# Patient Record
Sex: Female | Born: 1980 | Race: Black or African American | Hispanic: No | Marital: Single | Smoking: Former smoker
Health system: Southern US, Community
[De-identification: ages and names within clinical notes are randomized; demographics above are authoritative.]

## PROBLEM LIST (undated history)

## (undated) DIAGNOSIS — Z9889 Other specified postprocedural states: Secondary | ICD-10-CM

## (undated) DIAGNOSIS — J40 Bronchitis, not specified as acute or chronic: Secondary | ICD-10-CM

## (undated) DIAGNOSIS — R112 Nausea with vomiting, unspecified: Secondary | ICD-10-CM

## (undated) DIAGNOSIS — J45909 Unspecified asthma, uncomplicated: Secondary | ICD-10-CM

## (undated) HISTORY — DX: Unspecified asthma, uncomplicated: J45.909

## (undated) SURGERY — BREAST REDUCTION WITH LIPOSUCTION
Anesthesia: General | Laterality: Bilateral

---

## 1999-10-30 ENCOUNTER — Emergency Department (HOSPITAL_COMMUNITY): Admission: EM | Admit: 1999-10-30 | Discharge: 1999-10-30 | Payer: Self-pay | Admitting: Emergency Medicine

## 1999-10-30 ENCOUNTER — Encounter: Payer: Self-pay | Admitting: Emergency Medicine

## 2001-10-13 ENCOUNTER — Emergency Department (HOSPITAL_COMMUNITY): Admission: EM | Admit: 2001-10-13 | Discharge: 2001-10-14 | Payer: Self-pay | Admitting: Internal Medicine

## 2001-10-14 ENCOUNTER — Encounter: Payer: Self-pay | Admitting: *Deleted

## 2002-05-23 ENCOUNTER — Emergency Department (HOSPITAL_COMMUNITY): Admission: EM | Admit: 2002-05-23 | Discharge: 2002-05-24 | Payer: Self-pay | Admitting: Emergency Medicine

## 2002-05-24 ENCOUNTER — Encounter: Payer: Self-pay | Admitting: Emergency Medicine

## 2004-12-10 ENCOUNTER — Emergency Department (HOSPITAL_COMMUNITY): Admission: EM | Admit: 2004-12-10 | Discharge: 2004-12-10 | Payer: Self-pay | Admitting: Emergency Medicine

## 2005-06-06 ENCOUNTER — Ambulatory Visit (HOSPITAL_COMMUNITY): Admission: RE | Admit: 2005-06-06 | Discharge: 2005-06-06 | Payer: Self-pay | Admitting: Obstetrics & Gynecology

## 2005-10-11 ENCOUNTER — Inpatient Hospital Stay (HOSPITAL_COMMUNITY): Admission: AD | Admit: 2005-10-11 | Discharge: 2005-10-15 | Payer: Self-pay | Admitting: Obstetrics & Gynecology

## 2006-10-05 ENCOUNTER — Emergency Department (HOSPITAL_COMMUNITY): Admission: EM | Admit: 2006-10-05 | Discharge: 2006-10-06 | Payer: Self-pay | Admitting: Emergency Medicine

## 2007-03-25 ENCOUNTER — Emergency Department (HOSPITAL_COMMUNITY): Admission: EM | Admit: 2007-03-25 | Discharge: 2007-03-25 | Payer: Self-pay | Admitting: Emergency Medicine

## 2008-12-21 ENCOUNTER — Emergency Department (HOSPITAL_COMMUNITY): Admission: EM | Admit: 2008-12-21 | Discharge: 2008-12-21 | Payer: Self-pay | Admitting: Emergency Medicine

## 2009-07-05 ENCOUNTER — Ambulatory Visit: Payer: Self-pay | Admitting: Internal Medicine

## 2009-07-05 DIAGNOSIS — R05 Cough: Secondary | ICD-10-CM

## 2009-07-05 DIAGNOSIS — J45909 Unspecified asthma, uncomplicated: Secondary | ICD-10-CM | POA: Insufficient documentation

## 2009-07-05 DIAGNOSIS — R059 Cough, unspecified: Secondary | ICD-10-CM | POA: Insufficient documentation

## 2009-07-20 ENCOUNTER — Ambulatory Visit: Payer: Self-pay | Admitting: Internal Medicine

## 2009-07-20 DIAGNOSIS — J310 Chronic rhinitis: Secondary | ICD-10-CM | POA: Insufficient documentation

## 2009-09-17 ENCOUNTER — Emergency Department (HOSPITAL_COMMUNITY): Admission: EM | Admit: 2009-09-17 | Discharge: 2009-09-17 | Payer: Self-pay | Admitting: Emergency Medicine

## 2009-09-21 ENCOUNTER — Ambulatory Visit: Payer: Self-pay | Admitting: Internal Medicine

## 2009-10-09 HISTORY — PX: SHOULDER SURGERY: SHX246

## 2010-02-10 ENCOUNTER — Ambulatory Visit: Payer: Self-pay | Admitting: Internal Medicine

## 2010-04-15 ENCOUNTER — Telehealth (INDEPENDENT_AMBULATORY_CARE_PROVIDER_SITE_OTHER): Payer: Self-pay | Admitting: *Deleted

## 2010-05-02 ENCOUNTER — Telehealth: Payer: Self-pay | Admitting: Internal Medicine

## 2010-05-04 ENCOUNTER — Ambulatory Visit: Payer: Self-pay | Admitting: Internal Medicine

## 2010-06-16 ENCOUNTER — Telehealth: Payer: Self-pay | Admitting: Internal Medicine

## 2010-07-13 ENCOUNTER — Emergency Department (HOSPITAL_BASED_OUTPATIENT_CLINIC_OR_DEPARTMENT_OTHER)
Admission: EM | Admit: 2010-07-13 | Discharge: 2010-07-13 | Payer: Self-pay | Source: Home / Self Care | Admitting: Emergency Medicine

## 2010-07-14 ENCOUNTER — Telehealth: Payer: Self-pay | Admitting: Internal Medicine

## 2010-11-08 NOTE — Progress Notes (Signed)
Summary: nos appt  Phone Note Call from Patient   Caller: [email or web address]  Call For: [name] Summary of Call: Rsc nos from 9/8 to 10/5 @ 3:50p. Initial call taken by: Darletta Moll,  June 16, 2010 2:33 PM

## 2010-11-08 NOTE — Progress Notes (Signed)
Summary: nos appt  Phone Note Call from Patient   Caller: [email or web address]  Call For: Heather Wade Summary of Call: LMTCB x2 to rsc nos from 10/5. Initial call taken by: Darletta Moll,  July 14, 2010 3:21 PM

## 2010-11-08 NOTE — Assessment & Plan Note (Signed)
Summary: Pulmonary/ acute ext ov hfa 75% > change back to symbicort   Copy to:  Dr. Zonia Kief  Primary Provider/Referring Provider:  none  CC:  Acute visit.  Pt c/o increased SOB since started advair in May.  She states she has to use ventoilin approx twice a day and then wakes up in the night with SOB and has to use.  Marland Kitchen  History of Present Illness: 30 yo female quit smoking 04/2009  with h/o bronchitis in 3rd grade  then hosp 2006 [facility] with asthma exac with no chronic resp concerns or need for maint medication at that time  July 05, 2009 ov with new abrupt onset 03/2009  persistent cough assoc with sob, cough productive of green mucus rx with abx now white and scant but still coughing esp in middle of night with cough and sob. cough so hard she gags but does not vomit,  nasonex helped sinuses but not cough, ventolin helps some for a few hours. Singulair just started 2 weeks before ov ? helping at all ?   Prednisone made the most difference.   rec Pepcid 20mg  ac at bedtime and continue singulair start symbicort 160 2 puffs first thing  in am and 2 puffs again in pm about 12 hours later  ventolin hfa 2 puffs every 4 hours if needed  July 20, 2009  2 wk followup.  Pt states that her breathing has improved.  She has only needed rescue inhaler on one occasion.   September 21, 2009 Followup with PFT's wnl, only using symbiocort now  Feb 10, 2010 cc  ran out of symbicort about 2-3 wks ago due to insurance not covering med.  She c/o prod cough x 1 wk with green to clear sputum.  States that her breathing is the same.while on symbicort no need for ventolin at all now back to using it like she was before symbicort including waking up at night and needing it most nights.   rec advair 100/50 twice daily but worse and restarted albuterol twice daily to control cough and wheeze  May 04, 2010 Acute visit.  Pt c/o increased SOB since started advair in May.  She states she has to use ventoilin approx  twice a day and then wakes up in the night with SOB and has to use.  no purulent sputum or overt HB.  Pt denies any significant sore throat, dysphagia, itching, sneezing,  nasal congestion or excess secretions,  fever, chills, sweats, unintended wt loss, pleuritic or exertional cp, hempoptysis,  or leg swelling   Current Medications (verified): 1)  Ventolin Hfa 108 (90 Base) Mcg/act Aers (Albuterol Sulfate) .... 2 Puffs Every 4 Hours As Needed 2)  Ibuprofen 200 Mg Tabs (Ibuprofen) .... As Needed 3)  Albuterol Sulfate (2.5 Mg/8ml) 0.083% Nebu (Albuterol Sulfate) .Marland Kitchen.. 1 Vial in Nebulizer Two Times A Day As Needed  Allergies (verified): No Known Drug Allergies  Past History:  Past Medical History: Asthmatic Bronchitis    - HFA  90% July 05, 2009 > 100% July 20, 2009 >  75% September 21, 2009 > 90% May 04, 2010     - PFT's WNL  September 21, 2009     - Change to Advair 100 due to insurance Feb 10, 2010 > much worse May 04, 2010 > change back to symb  Vital Signs:  Patient profile:   30 year old female Weight:      154.50 pounds O2 Sat:  96 % on Room air Temp:     98.7 degrees F oral Pulse rate:   58 / minute BP sitting:   134 / 88  (left arm)  Vitals Entered By: Vernie Murders (May 04, 2010 11:10 AM)  O2 Flow:  Room air  Physical Exam  Additional Exam:  wt 159 July 05, 2009 > 159 July 20, 2009 > 158 September 21, 2009 > 156 Feb 10, 2010 > 154 May 04, 2010  HEENT: nl dentition, turbinates, and orophanx. Nl external ear canals without cough reflex NECK :  without JVD/Nodes/TM/ nl carotid upstrokes bilaterally LUNGS:mid exp wheeze bilaterally with mod increase t exp but no increase wob CV:  RRR  no s3 or murmur or increase in P2, no edema   ABD:  soft and nontender with nl excursion in the supine position. No bruits or organomegaly, bowel sounds nl MS:  warm without deformities, calf tenderness, cyanosis or clubbing      Impression &  Recommendations:  Problem # 1:  ASTHMA, UNSPECIFIED (ICD-493.90)   DDX of  difficult airways managment all start with A and  include Adherence, Ace Inhibitors, Acid Reflux, Active Sinus Disease, Alpha 1 Antitripsin deficiency, Anxiety masquerading as Airways dz,  ABPA,  allergy(esp in young), Aspiration (esp in elderly), Adverse effects of DPI,  Active smokers, plus one B  = Beta blocker use..   Adverse effect of dpi apparent here, much worse control with advair  Samples given for symbicort 160 2 puffs first thing  in am and 2 puffs again in pm about 12 hours later and I spent extra time with the patient today explaining optimal mdi  technique.  This improved from  50 -75% with coaching  Medications Added to Medication List This Visit: 1)  Albuterol Sulfate (2.5 Mg/66ml) 0.083% Nebu (Albuterol sulfate) .Marland Kitchen.. 1 vial in nebulizer two times a day as needed 2)  Symbicort 160-4.5 Mcg/act Aero (Budesonide-formoterol fumarate) .... 2 puffs first thing  in am and 2 puffs again in pm about 12 hours later  Other Orders: Est. Patient Level IV (16109)  Patient Instructions: 1)  Call [employer] and ask about mail order your for your symbicort  2)  Please schedule a follow-up appointment in 6 weeks, sooner if needed

## 2010-11-08 NOTE — Progress Notes (Signed)
Summary: nos appt  Phone Note Call from Patient   Caller: [email or web address]  Call For: wert Summary of Call: LMTCB to rsc nos from 7/7. Initial call taken by: Darletta Moll,  April 15, 2010 3:45 PM

## 2010-11-08 NOTE — Assessment & Plan Note (Signed)
Summary: Pulmonary/ ext f/u ov, try advair due to insurance issues   Copy to:  Dr. Zonia Kief  Primary Provider/Referring Provider:  none  CC:  Followup.  Pt states that she ran out of symbicort about 2-3 wks ago due to insurance not covering med.  She c/o prod cough x 1 wk with green to clear sputum.  States that her breathing is the same.Heather Wade  History of Present Illness: 30 yo female quit smoking 04/2009  with h/o bronchitis in 3rd grade  then hosp 2006 [facility] with asthma exac with no chronic resp concerns or need for maint medication at that time  July 05, 2009 ov with new abrupt onset 03/2009  persistent cough assoc with sob, cough productive of green mucus rx with abx now white and scant but still coughing esp in middle of night with cough and sob. cough so hard she gags but does not vomit,  nasonex helped sinuses but not cough, ventolin helps some for a few hours. Singulair just started 2 weeks before ov ? helping at all ?   Prednisone made the most difference.   rec Pepcid 20mg  ac at bedtime and continue singulair start symbicort 160 2 puffs first thing  in am and 2 puffs again in pm about 12 hours later  ventolin hfa 2 puffs every 4 hours if needed  July 20, 2009  2 wk followup.  Pt states that her breathing has improved.  She has only needed rescue inhaler on one occasion.   September 21, 2009 Followup with PFT's wnl, only using symbiocort now  Feb 10, 2010 cc  ran out of symbicort about 2-3 wks ago due to insurance not covering med.  She c/o prod cough x 1 wk with green to clear sputum.  States that her breathing is the same.while on symbicort no need for ventolin at all now back to using it like she was before symbicort including waking up at night and needing it most nights.  Pt denies any significant sore throat, nasal congestion or excess secretions, fever, chills, sweats, unintended wt loss, pleuritic or exertional cp, orthopnea pnd or leg swelling.  Pt also denies any obvious  fluctuation in symptoms with weather or environmental change or other alleviating or aggravating factors.       Current Medications (verified): 1)  Ventolin Hfa 108 (90 Base) Mcg/act Aers (Albuterol Sulfate) .... 2 Puffs Every 4 Hours As Needed 2)  Ibuprofen 200 Mg Tabs (Ibuprofen) .... As Needed  Allergies (verified): No Known Drug Allergies  Past History:  Past Medical History: Asthmatic Bronchitis    - HFA  90% July 05, 2009 > 100% July 20, 2009 >  75% September 21, 2009     - PFT's WNL  September 21, 2009     - Change to Advair 100 due to insurance Feb 10, 2010   Vital Signs:  Patient profile:   30 year old female Weight:      156 pounds O2 Sat:      97 % on Room air Temp:     98.3 degrees F oral Pulse rate:   77 / minute BP sitting:   124 / 84  (left arm)  Vitals Entered By: Vernie Murders (Feb 10, 2010 10:49 AM)  O2 Flow:  Room air  Physical Exam  Additional Exam:  wt 159 July 05, 2009 > 159 July 20, 2009 > 158 September 21, 2009 > 156 Feb 10, 2010  HEENT: nl dentition, turbinates, and  orophanx. Nl external ear canals without cough reflex NECK :  without JVD/Nodes/TM/ nl carotid upstrokes bilaterally LUNGS: no acc muscle use,clear bilaterally after using albuterol already this am CV:  RRR  no s3 or murmur or increase in P2, no edema   ABD:  soft and nontender with nl excursion in the supine position. No bruits or organomegaly, bowel sounds nl MS:  warm without deformities, calf tenderness, cyanosis or clubbing      Impression & Recommendations:  Problem # 1:  ASTHMA, UNSPECIFIED (ICD-493.90)   DDX of  difficult airways managment all start with A and  include Adherence, Ace Inhibitors, Acid Reflux, Active Sinus Disease, Alpha 1 Antitripsin deficiency, Anxiety masquerading as Airways dz,  ABPA,  allergy(esp in young), Aspiration (esp in elderly), Adverse effects of DPI,  Active smokers, plus one B  = Beta blocker use..   In this case Adherence is the  biggest issue and starts with  inability to use HFA effectively and also  understand that SABA treats the symptoms but doesn't get to the underlying problem (inflammation).  I used  the ananology of putting steroid cream on a rash to help explain the meaning of topical therapy and the need to get the drug to the target tissue.  Try advair  I discussed the importance of understanding the goals of asthma therapy including the rule of 2's as it applies to the use of a rescue inhaler.   Medications Added to Medication List This Visit: 1)  Advair Diskus 100-50 Mcg/dose Misc (Fluticasone-salmeterol) .... One twice daily 2)  Ventolin Hfa 108 (90 Base) Mcg/act Aers (Albuterol sulfate) .... 2 puffs every 4 hours as needed  Other Orders: Est. Patient Level IV (63875)  Patient Instructions: 1)  Start Advair 100/50 one twice daily and if breathing better and less need for rescue by the first of the week May 9th then see if your insurance  2)  If your breathing worsens or you need to use your rescue inhaler more than twice weekly or wake up more than twice a month with any respiratory symptoms or require more than two rescue inhalers per year, we need to see you right away. 3)    Prescriptions: VENTOLIN HFA 108 (90 BASE) MCG/ACT AERS (ALBUTEROL SULFATE) 2 puffs every 4 hours as needed  #1 x 2   Entered and Authorized by:   Nyoka Cowden MD   Signed by:   Nyoka Cowden MD on 02/10/2010   Method used:   Print then Give to Patient   RxID:   [id number] ADVAIR DISKUS 100-50 MCG/DOSE  MISC (FLUTICASONE-SALMETEROL) one twice daily  #1 x 11   Entered and Authorized by:   Nyoka Cowden MD   Signed by:   Nyoka Cowden MD on 02/10/2010   Method used:   Print then Give to Patient   RxID:   [id number]

## 2010-11-08 NOTE — Progress Notes (Signed)
Summary: nos appt  Phone Note Call from Patient   Caller: [email or web address]  Call For: Aerie Donica Summary of Call: Rsc nos from 7/22 to 7/27 @ 10:45a. Initial call taken by: Darletta Moll,  May 02, 2010 2:48 PM

## 2011-02-24 NOTE — H&P (Signed)
NAMELANAH, Heather Wade        ACCOUNT NO.:  [id number]   MEDICAL RECORD NO.:  [id number]          PATIENT TYPE:  INP   LOCATION:  9166                          FACILITY:  [facility]   PHYSICIAN:  Roseanna Rainbow, M.D.DATE OF BIRTH:  09/19/1981   DATE OF ADMISSION:  10/11/2005  DATE OF DISCHARGE:                                HISTORY & PHYSICAL   CHIEF COMPLAINT:  The patient is a 30 year old para 0 with an estimated date  of confinement of October 12, 2005, who presented to the office for routine  prenatal visit and was found to have elevated blood pressures.   HISTORY OF PRESENT ILLNESS:  Please see the above.  Blood pressures in the  office today were elevated as per Dr. Clearance Coots.  The patient denied any  symptoms consistent with severe pregnancy induced hypertension.   ALLERGIES:  No known drug allergies.   MEDICATIONS:  Prenatal vitamins.   LABORATORY DATA:  Blood type O positive, antibody screen negative.  One hour  GCT 115, GBS positive on December 14.  Hepatitis B surface antigen negative.  Hemoglobin 11.1, hematocrit 33.5, platelets 279,000.  Rubella immune.   SOCIAL HISTORY:  No tobacco, ethanol, or drug use.   PAST MEDICAL HISTORY:  She denies.   PAST SURGICAL HISTORY:  Wrist surgery.   PAST OB-GYN HISTORY:  She denies.   PHYSICAL EXAMINATION:  VITAL SIGNS:  Stable.  Blood pressures 150s over 90s.  Fetal heart tracing reassuring.  Tocodynamometer regular uterine  contractions.  GENERAL:  No apparent distress.  ABDOMEN:  Gravid.  VAGINAL:  As per the RN cervix is 1 cm dilated, 60 % effaced.  PIH lab panel normal.   ASSESSMENT:  1.  Primigravida with an intrauterine pregnancy at term.  2.  Mild pregnancy induced hypertension.  Fetal heart tracing consistent      with fetal well-being.  Borderline Bishop's score.  GBS positive.   PLAN:  1.  Admission.  2.  Two stage induction.  Will begin with cervical ripening to be followed      by Pitocin and AROM,  penicillin, GBS prophylaxis in early labor.      Roseanna Rainbow, M.D.  Electronically Signed     LAJ/MEDQ  D:  10/11/2005  T:  10/11/2005  Job:  [id number]

## 2011-02-24 NOTE — Consult Note (Signed)
NAME:  Heather Wade, Heather Wade                  ACCOUNT NO.:  [id number]   MEDICAL RECORD NO.:  [id number]                   PATIENT TYPE:  EMS   LOCATION:  ED                                   FACILITY:  [facility]   PHYSICIAN:  J. Darreld Mclean, M.D.              DATE OF BIRTH:  [birth year]   DATE OF CONSULTATION:  DATE OF DISCHARGE:  05/24/2002                          ORTHOPEDIC SURGERY CONSULTATION   HISTORY OF PRESENT ILLNESS:  The patient is a 30 year old female who fell  down some steps tonight and dislocated her right shoulder.  Dislocated it  once previously approximately two or three years ago.  No other injuries.   PAST SURGICAL HISTORY:  None.   ALLERGIES:  None.   MEDICATIONS:  None.   She last ate supper or some food around 7:00.  She had something to drink  around 9:00.  She was brought promptly to the hospital.  X-rays showed  dislocation of the right shoulder, no other injuries.  The patient is not  married.  Her sister brought her.   PHYSICAL EXAMINATION:  VITAL SIGNS:  Normal.   HEENT:  Negative.   NECK:  Supple.   LUNGS:  Clear to P&A.   HEART:  Regular without murmur, rub, or gallop.   ABDOMEN:  Soft, nontender without masses.   EXTREMITIES:  Pain and tenderness with any motion of the right shoulder,  obvious deformity.  Lower extremities within normal limits.  _________.  SKIN:  Intact.   IMPRESSION:  Dislocation anteriorly right shoulder.   Conscious sedation as explained to the patient.  The patient signed an  informed consent.  I explained what I would do.  Told her she may have some  temporary amnesia secondary to the conscious sedation.   Subconscious sedation protocol was begun.  IV had already been previously  inserted.  Versed was given by me slowly IV.  Used a small test dose,  monitored vital signs, and then she received a total of 6 mg.  When she was  sedated closed reduction was carried out through a modified Hypocratic  maneuver.   There was a audible clunk or pop and the shoulder was relocated.  The patient was placed in shoulder mobilizer, sat up.  Vital signs remained  stable.  X-rays were ordered and portable x-ray was done.  The patient  underwent procedure well.  Prescription given for Vicodin 5/500 for pain.  Patient information booklet given.  I will see in the office Monday morning.  If any difficulty or problems just come back to the emergency room.  Numbers  to contact me through the office and the hospital beeper system over the  weekend have been provided.  Teola Bradley, M.D.    JWK/MEDQ  D:  05/24/2002  T:  05/27/2002  Job:  [id number]

## 2011-08-13 ENCOUNTER — Emergency Department (HOSPITAL_COMMUNITY)
Admission: EM | Admit: 2011-08-13 | Discharge: 2011-08-13 | Disposition: A | Discharge status: Home / Self Care | Payer: Self-pay | Attending: Emergency Medicine | Admitting: Emergency Medicine

## 2011-08-13 DIAGNOSIS — R05 Cough: Secondary | ICD-10-CM | POA: Insufficient documentation

## 2011-08-13 DIAGNOSIS — R0789 Other chest pain: Secondary | ICD-10-CM | POA: Insufficient documentation

## 2011-08-13 DIAGNOSIS — R0602 Shortness of breath: Secondary | ICD-10-CM | POA: Insufficient documentation

## 2011-08-13 DIAGNOSIS — R059 Cough, unspecified: Secondary | ICD-10-CM | POA: Insufficient documentation

## 2011-08-13 DIAGNOSIS — J45909 Unspecified asthma, uncomplicated: Secondary | ICD-10-CM

## 2011-08-13 DIAGNOSIS — J45901 Unspecified asthma with (acute) exacerbation: Secondary | ICD-10-CM | POA: Insufficient documentation

## 2011-08-13 HISTORY — DX: Bronchitis, not specified as acute or chronic: J40

## 2011-08-13 MED ORDER — ALBUTEROL SULFATE HFA 108 (90 BASE) MCG/ACT IN AERS
2.0000 | INHALATION_SPRAY | RESPIRATORY_TRACT | Status: DC
  Administered 2011-08-13: 15:00:00 via RESPIRATORY_TRACT

## 2011-08-13 MED ORDER — ALBUTEROL SULFATE (5 MG/ML) 0.5% IN NEBU
INHALATION_SOLUTION | RESPIRATORY_TRACT | Status: AC
  Administered 2011-08-13: 5 mg via RESPIRATORY_TRACT
  Filled 2011-08-13: qty 1

## 2011-08-13 MED ORDER — ALBUTEROL SULFATE HFA 108 (90 BASE) MCG/ACT IN AERS
INHALATION_SPRAY | RESPIRATORY_TRACT | Status: AC
  Filled 2011-08-13: qty 6.7

## 2011-08-13 MED ORDER — PREDNISONE 10 MG PO TABS: 20.0000 mg | ORAL_TABLET | Freq: Every day | ORAL | Status: AC

## 2011-08-13 MED ORDER — PREDNISONE 20 MG PO TABS
ORAL_TABLET | ORAL | Status: AC
  Filled 2011-08-13: qty 3

## 2011-08-13 MED ORDER — ALBUTEROL SULFATE (5 MG/ML) 0.5% IN NEBU
5.0000 mg | INHALATION_SOLUTION | Freq: Once | RESPIRATORY_TRACT | Status: AC
  Administered 2011-08-13: 5 mg via RESPIRATORY_TRACT

## 2011-08-13 MED ORDER — PREDNISONE 20 MG PO TABS
60.0000 mg | ORAL_TABLET | Freq: Once | ORAL | Status: AC
  Administered 2011-08-13: 60 mg via ORAL

## 2011-08-13 MED ORDER — BUDESONIDE-FORMOTEROL FUMARATE 160-4.5 MCG/ACT IN AERO: 2.0000 | INHALATION_SPRAY | Freq: Two times a day (BID) | RESPIRATORY_TRACT | Status: DC

## 2011-08-13 NOTE — ED Provider Notes (Signed)
History     CSN: [id number] Arrival date & time: 08/13/2011 10:50 AM   First MD Initiated Contact with Patient 08/13/11 1118      Chief Complaint  Patient presents with  . Shortness of Breath    pt in with stated sob states was dx with acute bronchitis last year states no medication taken onset 2 weeks ago with no relief    (Consider location/radiation/quality/duration/timing/severity/associated sxs/prior treatment) HPI Complains of nonproductive cough shortness of breath and wheeze onset one month ago, worse over the past few days no fever symptoms typical of asthmatic bronchitis she's had in the past. Patient states she ran out of her albuterol and Symbicort inhalers. No treatment prior to coming here. Complains of chest tightness with cough, symptoms moderate nonradiating. No other associated symptoms Past Medical History  Diagnosis Date  . Bronchitis     History reviewed. No pertinent past surgical history.  No family history on file.  History  Substance Use Topics  . Smoking status: Not on file  . Smokeless tobacco: Not on file  . Alcohol Use: Yes     occasional drinker   Smokes marijuana OB History    Grav Para Term Preterm Abortions TAB SAB Ect Mult Living                  Review of Systems  Constitutional: Negative.   HENT: Negative.   Respiratory: Positive for chest tightness, shortness of breath and wheezing.   Cardiovascular: Negative.   Gastrointestinal: Negative.   Musculoskeletal: Negative.   Skin: Negative.   Neurological: Negative.   Hematological: Negative.   Psychiatric/Behavioral: Negative.   All other systems reviewed and are negative.    Allergies  Review of patient's allergies indicates no known allergies.  Home Medications   Current Outpatient Rx  Name Route Sig Dispense Refill  . ALBUTEROL SULFATE HFA 108 (90 BASE) MCG/ACT IN AERS Inhalation Inhale 2 puffs into the lungs every 4 (four) hours as needed. For shortness of breath.       . BUDESONIDE-FORMOTEROL FUMARATE 160-4.5 MCG/ACT IN AERO Inhalation Inhale 2 puffs into the lungs 2 (two) times daily.        BP 121/89  Pulse 95  Temp(Src) 98.8 F (37.1 C) (Oral)  Resp 16  SpO2 100%  LMP 07/17/2011  Physical Exam  Nursing note and vitals reviewed. Constitutional: She appears well-developed and well-nourished.  HENT:  Head: Normocephalic and atraumatic.  Eyes: Conjunctivae are normal. Pupils are equal, round, and reactive to light.  Neck: Neck supple. No tracheal deviation present. No thyromegaly present.  Cardiovascular: Normal rate and regular rhythm.   No murmur heard. Pulmonary/Chest: She has wheezes.       Prolonged expiratory phase  Abdominal: Soft. Bowel sounds are normal. She exhibits no distension. There is no tenderness.  Musculoskeletal: Normal range of motion. She exhibits no edema and no tenderness.  Neurological: She is alert. Coordination normal.  Skin: Skin is warm and dry. No rash noted.  Psychiatric: She has a normal mood and affect.    ED Course  Procedures (including critical care time)  Labs Reviewed - No data to display No results found.   No diagnosis found.  2:45 PM patient is breathing at baseline speaks in paragraphs no distress MDM  Plan prescription for Symbicort inhaler albuterol inhaler to go to use 2 puffs every 4 hours when necessary cough or shortness of breath prescription prednisone Referral [facility] Diagnoses asthamatic bronchitis  Doug Sou, MD 08/13/11 1455

## 2011-08-13 NOTE — ED Notes (Signed)
Respiratory contacted to administer breathing treatment. Pt in No distress.

## 2011-11-18 ENCOUNTER — Encounter (HOSPITAL_COMMUNITY): Payer: Self-pay | Admitting: Emergency Medicine

## 2011-11-18 ENCOUNTER — Emergency Department (HOSPITAL_COMMUNITY): Payer: Self-pay

## 2011-11-18 ENCOUNTER — Emergency Department (HOSPITAL_COMMUNITY)
Admission: EM | Admit: 2011-11-18 | Discharge: 2011-11-18 | Disposition: A | Discharge status: Home / Self Care | Payer: Self-pay | Attending: Emergency Medicine | Admitting: Emergency Medicine

## 2011-11-18 DIAGNOSIS — R062 Wheezing: Secondary | ICD-10-CM | POA: Insufficient documentation

## 2011-11-18 DIAGNOSIS — R059 Cough, unspecified: Secondary | ICD-10-CM | POA: Insufficient documentation

## 2011-11-18 DIAGNOSIS — R05 Cough: Secondary | ICD-10-CM | POA: Insufficient documentation

## 2011-11-18 DIAGNOSIS — R0602 Shortness of breath: Secondary | ICD-10-CM | POA: Insufficient documentation

## 2011-11-18 DIAGNOSIS — J40 Bronchitis, not specified as acute or chronic: Secondary | ICD-10-CM | POA: Insufficient documentation

## 2011-11-18 MED ORDER — IPRATROPIUM BROMIDE 0.02 % IN SOLN
0.5000 mg | Freq: Once | RESPIRATORY_TRACT | Status: AC
  Administered 2011-11-18: 0.5 mg via RESPIRATORY_TRACT
  Filled 2011-11-18: qty 2.5

## 2011-11-18 MED ORDER — ALBUTEROL SULFATE (5 MG/ML) 0.5% IN NEBU
5.0000 mg | INHALATION_SOLUTION | Freq: Once | RESPIRATORY_TRACT | Status: AC
  Administered 2011-11-18: 5 mg via RESPIRATORY_TRACT
  Filled 2011-11-18: qty 1

## 2011-11-18 MED ORDER — ALBUTEROL SULFATE HFA 108 (90 BASE) MCG/ACT IN AERS
2.0000 | INHALATION_SPRAY | RESPIRATORY_TRACT | Status: AC
  Administered 2011-11-18: 2 via RESPIRATORY_TRACT
  Filled 2011-11-18: qty 6.7

## 2011-11-18 MED ORDER — IPRATROPIUM BROMIDE 0.02 % IN SOLN
RESPIRATORY_TRACT | Status: AC
  Administered 2011-11-18: 0.5 mg
  Filled 2011-11-18: qty 2.5

## 2011-11-18 MED ORDER — PREDNISONE 20 MG PO TABS
60.0000 mg | ORAL_TABLET | Freq: Once | ORAL | Status: AC
  Administered 2011-11-18: 60 mg via ORAL
  Filled 2011-11-18: qty 3

## 2011-11-18 MED ORDER — PREDNISONE 20 MG PO TABS: 20.0000 mg | ORAL_TABLET | Freq: Every day | ORAL | Status: AC

## 2011-11-18 MED ORDER — ALBUTEROL SULFATE (5 MG/ML) 0.5% IN NEBU
INHALATION_SOLUTION | RESPIRATORY_TRACT | Status: AC
  Administered 2011-11-18: 5 mg
  Filled 2011-11-18: qty 1

## 2011-11-18 NOTE — ED Notes (Signed)
Patient verbalized complete understanding of d/c home and medication administration instructions.

## 2011-11-18 NOTE — ED Provider Notes (Signed)
History     CSN: [id number]  Arrival date & time 11/18/11  1839   First MD Initiated Contact with Patient 11/18/11 1952      Chief Complaint  Patient presents with  . Cough  . Shortness of Breath    HPI Pt was seen at 2040.  Per pt, c/o gradual onset and persistence of constant cough, wheezing and SOB x1 week.  Symptoms began after she ran out of her MDI.  Denies fevers, no CP/palpitations, no abd pain, no N/V/D, no back pain, no rash.    Past Medical History  Diagnosis Date  . Bronchitis     History reviewed. No pertinent past surgical history.   History  Substance Use Topics  . Smoking status: Not on file  . Smokeless tobacco: Not on file  . Alcohol Use: Yes     occasional drinker    Review of Systems ROS: Statement: All systems negative except as marked or noted in the HPI; Constitutional: Negative for fever and chills. ; ; Eyes: Negative for eye pain, redness and discharge. ; ; ENMT: Negative for ear pain, hoarseness, nasal congestion, sinus pressure and sore throat. ; ; Cardiovascular: Negative for chest pain, palpitations, diaphoresis, and peripheral edema. ; ; Respiratory: Positive for cough, wheezing and negative for stridor. ; ; Gastrointestinal: Negative for nausea, vomiting, diarrhea, abdominal pain, blood in stool, hematemesis, jaundice and rectal bleeding. . ; ; Genitourinary: Negative for dysuria, flank pain and hematuria. ; ; Musculoskeletal: Negative for back pain and neck pain. Negative for swelling and trauma.; ; Skin: Negative for pruritus, rash, abrasions, blisters, bruising and skin lesion.; ; Neuro: Negative for headache, lightheadedness and neck stiffness. Negative for weakness, altered level of consciousness , altered mental status, extremity weakness, paresthesias, involuntary movement, seizure and syncope.     Allergies  Review of patient's allergies indicates no known allergies.  Home Medications   Current Outpatient Rx  Name Route Sig Dispense  Refill  . ACETAMINOPHEN 500 MG PO TABS Oral Take 1,000 mg by mouth every 6 (six) hours as needed. For pain    . ALBUTEROL SULFATE HFA 108 (90 BASE) MCG/ACT IN AERS Inhalation Inhale 2 puffs into the lungs every 4 (four) hours as needed. For shortness of breath.     . ALBUTEROL SULFATE (2.5 MG/3ML) 0.083% IN NEBU Nebulization Take 2.5 mg by nebulization every 6 (six) hours as needed. For shortness of breath    . BENZOCAINE 10 % MT GEL Mouth/Throat Use as directed 1 application in the mouth or throat as needed. For tooth pain    . BUDESONIDE-FORMOTEROL FUMARATE 160-4.5 MCG/ACT IN AERO Inhalation Inhale 2 puffs into the lungs 2 (two) times daily.      . TETRAHYDROZOLINE HCL 0.05 % OP SOLN Both Eyes Place 2 drops into both eyes 2 (two) times daily.      BP 126/89  Pulse 105  Temp(Src) 98.1 F (36.7 C) (Oral)  Resp 18  Ht 5\' 5"  (1.651 m)  Wt 148 lb (67.132 kg)  BMI 24.63 kg/m2  SpO2 95%  LMP 11/03/2011  Physical Exam 2045: Physical examination:  Nursing notes reviewed; Vital signs and O2 SAT reviewed;  Constitutional: Well developed, Well nourished, Well hydrated, In no acute distress; Head:  Normocephalic, atraumatic; Eyes: EOMI, PERRL, No scleral icterus; ENMT: TM's clear bilat.  Mouth and pharynx normal, Mucous membranes moist; Neck: Supple, Full range of motion, No lymphadenopathy; Cardiovascular: Regular rate and rhythm, No murmur, rub, or gallop; Respiratory: Breath sounds coarse & equal  bilaterally, scattered wheezes bilat, Speaking full sentences with ease. Normal respiratory effort/excursion; Chest: Nontender, Movement normal; Abdomen: Soft, Nontender, Nondistended, Normal bowel sounds; Extremities: Pulses normal, No tenderness, No edema, No calf edema or asymmetry.; Neuro: AA&Ox3, Major CN grossly intact.  No gross focal motor or sensory deficits in extremities.; Skin: Color normal, Warm, Dry, no rash.    ED Course  Procedures    MDM  MDM Reviewed: nursing note and  vitals Interpretation: x-ray     Dg Chest 2 View 11/18/2011  *RADIOLOGY REPORT*  Clinical Data: Cough, chest pain, short of breath.  CHEST - 2 VIEW  Comparison:  None.  Findings:  The heart size and mediastinal contours are within normal limits.  Both lungs are clear.  The visualized skeletal structures are unremarkable.  IMPRESSION: No active cardiopulmonary disease.  Original Report Authenticated By: Camelia Phenes, M.D.     10:05 PM:  Feels improved after neb, Sats 96% R/A, resps easy, lungs coarse bilat without wheezing, speaking full sentences with ease.  Wants to go home now.  Dx testing d/w pt.  Questions answered.  Verb understanding, agreeable to d/c home with outpt f/u.      Laray Anger, DO 11/20/11 1340

## 2011-11-18 NOTE — ED Notes (Signed)
Pt c/o cough and shortness of breath for a week.  Pt has a rescue inhaler from a prior dx of bronchitis which she has tried using without relief.

## 2013-03-02 ENCOUNTER — Emergency Department (HOSPITAL_COMMUNITY)
Admission: EM | Admit: 2013-03-02 | Discharge: 2013-03-02 | Disposition: A | Discharge status: Home / Self Care | Payer: Medicaid Other | Attending: Emergency Medicine | Admitting: Emergency Medicine

## 2013-03-02 ENCOUNTER — Encounter (HOSPITAL_COMMUNITY): Payer: Self-pay | Admitting: Emergency Medicine

## 2013-03-02 DIAGNOSIS — T7840XA Allergy, unspecified, initial encounter: Secondary | ICD-10-CM

## 2013-03-02 DIAGNOSIS — Z8709 Personal history of other diseases of the respiratory system: Secondary | ICD-10-CM | POA: Insufficient documentation

## 2013-03-02 DIAGNOSIS — Z79899 Other long term (current) drug therapy: Secondary | ICD-10-CM | POA: Insufficient documentation

## 2013-03-02 DIAGNOSIS — L500 Allergic urticaria: Secondary | ICD-10-CM | POA: Insufficient documentation

## 2013-03-02 DIAGNOSIS — IMO0002 Reserved for concepts with insufficient information to code with codable children: Secondary | ICD-10-CM | POA: Insufficient documentation

## 2013-03-02 DIAGNOSIS — L509 Urticaria, unspecified: Secondary | ICD-10-CM

## 2013-03-02 MED ORDER — PREDNISONE 20 MG PO TABS: 40.0000 mg | ORAL_TABLET | Freq: Every day | ORAL | Status: DC

## 2013-03-02 MED ORDER — FAMOTIDINE 20 MG PO TABS
40.0000 mg | ORAL_TABLET | Freq: Once | ORAL | Status: AC
  Administered 2013-03-02: 40 mg via ORAL
  Filled 2013-03-02: qty 2

## 2013-03-02 MED ORDER — PREDNISONE 20 MG PO TABS
60.0000 mg | ORAL_TABLET | Freq: Once | ORAL | Status: AC
  Administered 2013-03-02: 60 mg via ORAL
  Filled 2013-03-02: qty 3

## 2013-03-02 NOTE — ED Notes (Signed)
Pt states that about a week ago, she switched laundry detergents, since then she has developed an intermittent rash on her legs, abd and arms.  States that sometimes her lips swell (not today).  Also c/o that her right hand is swollen since this morning.

## 2013-03-02 NOTE — ED Provider Notes (Signed)
History     CSN: [id number]  Arrival date & time 03/02/13  0830   First MD Initiated Contact with Patient 03/02/13 [phone]      Chief Complaint  Patient presents with  . Rash     HPI Pt was seen at 0905.   Per pt, c/o gradual onset and persistence of waxing and waning "itchy rash" that began 5 days ago.  Pt states the rash is "itchy welps" that "come and go."  Pt has been taking benadryl with partial relief.  States the rash began several days after switching laundry detergents.  Denies intra-oral edema, no SOB/CP, no dysphagia, no hoarse voice, no wheezing/stridor.      Past Medical History  Diagnosis Date  . Bronchitis     No past surgical history on file.    History  Substance Use Topics  . Smoking status: Never Smoker   . Smokeless tobacco: Not on file  . Alcohol Use: Yes     Comment: occasional drinker     Review of Systems ROS: Statement: All systems negative except as marked or noted in the HPI; Constitutional: Negative for fever and chills. ; ; Eyes: Negative for eye pain, redness and discharge. ; ; ENMT: Negative for ear pain, hoarseness, nasal congestion, sinus pressure and sore throat. ; ; Cardiovascular: Negative for chest pain, palpitations, diaphoresis, dyspnea and peripheral edema. ; ; Respiratory: Negative for cough, wheezing and stridor. ; ; Gastrointestinal: Negative for nausea, vomiting, diarrhea, abdominal pain, blood in stool, hematemesis, jaundice and rectal bleeding. . ; ; Genitourinary: Negative for dysuria, flank pain and hematuria. ; ; Musculoskeletal: Negative for back pain and neck pain. Negative for swelling and trauma.; ; Skin: +rash, pruritis. Negative for abrasions, blisters, bruising and skin lesion.; ; Neuro: Negative for headache, lightheadedness and neck stiffness. Negative for weakness, altered level of consciousness , altered mental status, extremity weakness, paresthesias, involuntary movement, seizure and syncope.       Allergies  Review  of patient's allergies indicates no known allergies.  Home Medications   Current Outpatient Rx  Name  Route  Sig  Dispense  Refill  . acetaminophen (TYLENOL) 500 MG tablet   Oral   Take 1,000 mg by mouth every 6 (six) hours as needed. For pain         . albuterol (PROVENTIL HFA;VENTOLIN HFA) 108 (90 BASE) MCG/ACT inhaler   Inhalation   Inhale 2 puffs into the lungs every 4 (four) hours as needed. For shortness of breath.          Marland Kitchen ibuprofen (ADVIL,MOTRIN) 200 MG tablet   Oral   Take 200 mg by mouth every 6 (six) hours as needed for pain.         Marland Kitchen ketoconazole (NIZORAL) 2 % cream   Topical   Apply 1 application topically daily.         . MetroNIDAZOLE (FLAGYL PO)   Oral   Take 1 tablet by mouth daily.         Marland Kitchen tetrahydrozoline 0.05 % ophthalmic solution   Both Eyes   Place 2 drops into both eyes 2 (two) times daily.         . budesonide-formoterol (SYMBICORT) 160-4.5 MCG/ACT inhaler   Inhalation   Inhale 2 puffs into the lungs 2 (two) times daily.           . predniSONE (DELTASONE) 20 MG tablet   Oral   Take 2 tablets (40 mg total) by mouth daily. Start 03/03/2013  10 tablet   0     BP 120/93  Pulse 88  Temp(Src) 98.3 F (36.8 C) (Oral)  Resp 20  SpO2 100%  LMP 02/21/2013  Physical Exam 0910: Physical examination:  Nursing notes reviewed; Vital signs and O2 SAT reviewed;  Constitutional: Well developed, Well nourished, Well hydrated, In no acute distress; Head:  Normocephalic, atraumatic; Eyes: EOMI, PERRL, No scleral icterus; ENMT: Mouth and pharynx without lesions. No tonsillar exudates. No intra-oral edema. No submandibular or sublingual edema. No hoarse voice, no drooling, no stridor. No pain with manipulation of larynx. Mouth and pharynx normal, Mucous membranes moist; Neck: Supple, Full range of motion, No lymphadenopathy; Cardiovascular: Regular rate and rhythm, No murmur, rub, or gallop; Respiratory: Breath sounds clear & equal bilaterally,  No rales, rhonchi, wheezes.  Speaking full sentences with ease, Normal respiratory effort/excursion; Chest: Nontender, Movement normal; Abdomen: Soft, Nontender, Nondistended, Normal bowel sounds;; Extremities: Pulses normal, No tenderness, No edema, No calf edema or asymmetry.; Neuro: AA&Ox3, Major CN grossly intact.  Speech clear. Climbs on and off stretcher easily by herself. Gait steady. No gross focal motor or sensory deficits in extremities.; Skin: Color normal, Warm, Dry, +scattered hives.   ED Course  Procedures      MDM  MDM Reviewed: nursing note and vitals     0920:  Rash appears to be hives.  No angioedema. Already took benadryl; will dose pepcid and prednisone. Dx d/w pt.  Questions answered.  Verb understanding, agreeable to d/c home with outpt f/u.        Laray Anger, DO 03/04/13 1353

## 2014-04-06 ENCOUNTER — Ambulatory Visit (INDEPENDENT_AMBULATORY_CARE_PROVIDER_SITE_OTHER): Payer: Medicaid Other | Admitting: Obstetrics

## 2014-04-06 ENCOUNTER — Encounter: Payer: Self-pay | Admitting: Obstetrics

## 2014-04-06 VITALS — BP 124/80 | HR 71 | Temp 99.7°F | Ht 65.0 in | Wt 158.0 lb

## 2014-04-06 DIAGNOSIS — Z30431 Encounter for routine checking of intrauterine contraceptive device: Secondary | ICD-10-CM

## 2014-04-06 DIAGNOSIS — K644 Residual hemorrhoidal skin tags: Secondary | ICD-10-CM | POA: Insufficient documentation

## 2014-04-06 DIAGNOSIS — Z0000 Encounter for general adult medical examination without abnormal findings: Secondary | ICD-10-CM

## 2014-04-06 MED ORDER — HYDROCORTISONE ACETATE 25 MG RE SUPP: 25.0000 mg | Freq: Two times a day (BID) | RECTAL | Status: DC | PRN

## 2014-04-07 LAB — RPR

## 2014-04-07 LAB — HEPATITIS B SURFACE ANTIGEN: Hepatitis B Surface Ag: NEGATIVE

## 2014-04-07 LAB — GC/CHLAMYDIA PROBE AMP
CT PROBE, AMP APTIMA: NEGATIVE
GC Probe RNA: NEGATIVE

## 2014-04-07 LAB — HEPATITIS C ANTIBODY: HCV AB: NEGATIVE

## 2014-04-07 LAB — WET PREP BY MOLECULAR PROBE
CANDIDA SPECIES: NEGATIVE
GARDNERELLA VAGINALIS: POSITIVE — AB
Trichomonas vaginosis: NEGATIVE

## 2014-04-07 LAB — HIV ANTIBODY (ROUTINE TESTING W REFLEX): HIV: NONREACTIVE

## 2014-04-07 NOTE — Progress Notes (Addendum)
Quick Note:  Tinidazole 1 gram po daily x 5 days. ______ Subjective:     Heather Wade is a 33 y.o. female here for a routine exam.  Current complaints: None.    Personal health questionnaire:  Is patient Ashkenazi Jewish, have a family history of breast and/or ovarian cancer: no Is there a family history of uterine cancer diagnosed at age < 2550, gastrointestinal cancer, urinary tract cancer, family member who is a Personnel officerLynch syndrome-associated carrier: no Is the patient overweight and hypertensive, family history of diabetes, personal history of gestational diabetes or PCOS: no Is patient over 4255, have PCOS,  family history of premature CHD under age 35, diabetes, smoke, have hypertension or peripheral artery disease:  no  The HPI was reviewed and explored in further detail by the provider. Gynecologic History No LMP recorded. Patient is not currently having periods (Reason: IUD). Contraception: IUD Last Pap: 2014. Results were: normal Last mammogram: n/a. Results were: n/a  Obstetric History OB History  No data available    Past Medical History  Diagnosis Date  . Bronchitis   . Asthma     Past Surgical History  Procedure Laterality Date  . Shoulder surgery Right 2011    Current outpatient prescriptions:acetaminophen (TYLENOL) 500 MG tablet, Take 1,000 mg by mouth every 6 (six) hours as needed. For pain, Disp: , Rfl: ;  albuterol (PROVENTIL HFA;VENTOLIN HFA) 108 (90 BASE) MCG/ACT inhaler, Inhale 2 puffs into the lungs every 4 (four) hours as needed. For shortness of breath. , Disp: , Rfl: ;  budesonide-formoterol (SYMBICORT) 160-4.5 MCG/ACT inhaler, Inhale 2 puffs into the lungs 2 (two) times daily.  , Disp: , Rfl:  ibuprofen (ADVIL,MOTRIN) 200 MG tablet, Take 200 mg by mouth every 6 (six) hours as needed for pain., Disp: , Rfl: ;  hydrocortisone (ANUSOL-HC) 25 MG suppository, Place 1 suppository (25 mg total) rectally 2 (two) times daily as needed for hemorrhoids or  itching., Disp: 24 suppository, Rfl: prn Allergies  Allergen Reactions  . Metronidazole Hives    History  Substance Use Topics  . Smoking status: Never Smoker   . Smokeless tobacco: Not on file  . Alcohol Use: Yes     Comment: occasional drinker    Family History  Problem Relation Age of Onset  . Cancer Mother   . Cancer Maternal Grandmother   . Cancer Maternal Grandfather   . Cancer Paternal Grandmother   . Cancer Paternal Grandfather       Review of Systems  Constitutional: negative for fatigue and weight loss Respiratory: negative for cough and wheezing Cardiovascular: negative for chest pain, fatigue and palpitations Gastrointestinal: negative for abdominal pain and change in bowel habits Musculoskeletal:negative for myalgias Neurological: negative for gait problems and tremors Behavioral/Psych: negative for abusive relationship, depression Endocrine: negative for temperature intolerance   Genitourinary:negative for abnormal menstrual periods, genital lesions, hot flashes, sexual problems and vaginal discharge Integument/breast: negative for breast lump, breast tenderness, nipple discharge and skin lesion(s)    Objective:       BP 124/80  Pulse 71  Temp(Src) 99.7 F (37.6 C)  Ht 5\' 5"  (1.651 m)  Wt 158 lb (71.668 kg)  BMI 26.29 kg/m2 General:   alert  Skin:   no rash or abnormalities  Lungs:   clear to auscultation bilaterally  Heart:   regular rate and rhythm, S1, S2 normal, no murmur, click, rub or gallop  Breasts:   normal without suspicious masses, skin or nipple changes or axillary nodes  Abdomen:  normal findings: no organomegaly, soft, non-tender and no hernia  Pelvis:  External genitalia: normal general appearance Urinary system: urethral meatus normal and bladder without fullness, nontender Vaginal: normal without tenderness, induration or masses Cervix: normal appearance Adnexa: normal bimanual exam Uterus: anteverted and non-tender, normal size    Lab Review Urine pregnancy test Labs reviewed yes Radiologic studies reviewed no    Assessment:    Healthy female exam.    Plan:    Education reviewed: low fat, low cholesterol diet, safe sex/STD prevention, self breast exams and weight bearing exercise. Contraception: IUD. Follow up in: 1 year.   Meds ordered this encounter  Medications  . hydrocortisone (ANUSOL-HC) 25 MG suppository    Sig: Place 1 suppository (25 mg total) rectally 2 (two) times daily as needed for hemorrhoids or itching.    Dispense:  24 suppository    Refill:  prn   Orders Placed This Encounter  Procedures  . WET PREP BY MOLECULAR PROBE  . GC/Chlamydia Probe Amp  . HIV antibody  . Hepatitis B surface antigen  . RPR  . Hepatitis C antibody

## 2014-04-08 LAB — PAP IG AND HPV HIGH-RISK: HPV DNA High Risk: NOT DETECTED

## 2014-04-23 ENCOUNTER — Other Ambulatory Visit: Payer: Self-pay | Admitting: *Deleted

## 2014-04-23 DIAGNOSIS — N760 Acute vaginitis: Principal | ICD-10-CM

## 2014-04-23 DIAGNOSIS — B9689 Other specified bacterial agents as the cause of diseases classified elsewhere: Secondary | ICD-10-CM

## 2014-04-23 MED ORDER — CLINDAMYCIN PHOSPHATE (1 DOSE) 2 % VA CREA: 1.0000 | TOPICAL_CREAM | Freq: Once | VAGINAL | Status: DC

## 2014-04-23 NOTE — Progress Notes (Signed)
After reviewing allergies with Dr Clearance CootsHarper, he has ordered Clindesse to be sent to pharmacy for treatment of positive BV.

## 2014-05-06 ENCOUNTER — Telehealth: Payer: Self-pay | Admitting: *Deleted

## 2014-05-06 NOTE — Telephone Encounter (Signed)
Pt called to office regarding a Rx that was sent in for her.  Pt asked for return call. Call placed to pt.  Pt states that vaginal cream that was prescribed for her is to expensive as not covered by insurance.  Pt made aware that I would f/u regarding treatment options and would find another medication that could be prescribed.   Call placed to pharmacy, Cleocin ovules 100g vaginally x 3 days, was called in and pharmacy confirms it is covered by her insurance.   Call placed again to pt, no answer.  Left message that another Rx was sent to pharmacy that is covered by insurance.  Pt advised to contact office with any furthers concerns.

## 2016-04-10 ENCOUNTER — Ambulatory Visit: Payer: Medicaid Other | Admitting: Obstetrics

## 2016-12-20 ENCOUNTER — Encounter (HOSPITAL_BASED_OUTPATIENT_CLINIC_OR_DEPARTMENT_OTHER): Payer: Self-pay | Admitting: *Deleted

## 2016-12-25 ENCOUNTER — Ambulatory Visit (HOSPITAL_BASED_OUTPATIENT_CLINIC_OR_DEPARTMENT_OTHER): Payer: No Typology Code available for payment source | Admitting: Anesthesiology

## 2016-12-25 ENCOUNTER — Ambulatory Visit (HOSPITAL_BASED_OUTPATIENT_CLINIC_OR_DEPARTMENT_OTHER)
Admission: RE | Admit: 2016-12-25 | Discharge: 2016-12-25 | Disposition: A | Discharge status: Home / Self Care | Payer: No Typology Code available for payment source | Source: Ambulatory Visit | Attending: Specialist | Admitting: Specialist

## 2016-12-25 ENCOUNTER — Encounter (HOSPITAL_BASED_OUTPATIENT_CLINIC_OR_DEPARTMENT_OTHER): Payer: Self-pay | Admitting: Anesthesiology

## 2016-12-25 ENCOUNTER — Encounter (HOSPITAL_BASED_OUTPATIENT_CLINIC_OR_DEPARTMENT_OTHER)
Admission: RE | Disposition: A | Discharge status: Home / Self Care | Payer: Self-pay | Source: Ambulatory Visit | Attending: Specialist

## 2016-12-25 DIAGNOSIS — J45909 Unspecified asthma, uncomplicated: Secondary | ICD-10-CM | POA: Diagnosis not present

## 2016-12-25 DIAGNOSIS — F17200 Nicotine dependence, unspecified, uncomplicated: Secondary | ICD-10-CM | POA: Insufficient documentation

## 2016-12-25 DIAGNOSIS — Z791 Long term (current) use of non-steroidal anti-inflammatories (NSAID): Secondary | ICD-10-CM | POA: Insufficient documentation

## 2016-12-25 DIAGNOSIS — N62 Hypertrophy of breast: Secondary | ICD-10-CM | POA: Insufficient documentation

## 2016-12-25 DIAGNOSIS — Z7951 Long term (current) use of inhaled steroids: Secondary | ICD-10-CM | POA: Diagnosis not present

## 2016-12-25 DIAGNOSIS — N6091 Unspecified benign mammary dysplasia of right breast: Secondary | ICD-10-CM | POA: Diagnosis not present

## 2016-12-25 HISTORY — PX: BREAST REDUCTION SURGERY: SHX8

## 2016-12-25 HISTORY — DX: Other specified postprocedural states: R11.2

## 2016-12-25 HISTORY — DX: Other specified postprocedural states: Z98.890

## 2016-12-25 SURGERY — MAMMOPLASTY, REDUCTION
Anesthesia: General | Site: Breast | Laterality: Bilateral

## 2016-12-25 MED ORDER — ONDANSETRON HCL 4 MG/2ML IJ SOLN
INTRAMUSCULAR | Status: DC | PRN
  Administered 2016-12-25: 4 mg via INTRAVENOUS

## 2016-12-25 MED ORDER — CEFAZOLIN SODIUM-DEXTROSE 2-4 GM/100ML-% IV SOLN
2.0000 g | INTRAVENOUS | Status: AC
  Administered 2016-12-25: 2 g via INTRAVENOUS

## 2016-12-25 MED ORDER — LACTATED RINGERS IV SOLN
INTRAVENOUS | Status: DC | PRN
  Administered 2016-12-25: 1000 mL

## 2016-12-25 MED ORDER — SCOPOLAMINE 1 MG/3DAYS TD PT72
1.0000 | MEDICATED_PATCH | Freq: Once | TRANSDERMAL | Status: DC | PRN
Start: 2016-12-25 — End: 2016-12-25

## 2016-12-25 MED ORDER — EPINEPHRINE PF 1 MG/ML IJ SOLN
INTRAMUSCULAR | Status: DC | PRN
  Administered 2016-12-25: 1 mL

## 2016-12-25 MED ORDER — HYDROMORPHONE HCL 1 MG/ML IJ SOLN: 0.2500 mg | INTRAMUSCULAR | Status: DC | PRN

## 2016-12-25 MED ORDER — LACTATED RINGERS IV SOLN
INTRAVENOUS | Status: DC
  Administered 2016-12-25: 07:00:00 via INTRAVENOUS

## 2016-12-25 MED ORDER — LACTATED RINGERS IV SOLN
INTRAVENOUS | Status: DC | PRN
  Administered 2016-12-25 (×2): via INTRAVENOUS

## 2016-12-25 MED ORDER — PROPOFOL 10 MG/ML IV BOLUS
INTRAVENOUS | Status: AC
  Filled 2016-12-25: qty 20

## 2016-12-25 MED ORDER — LIDOCAINE HCL 2 % IJ SOLN
INTRAMUSCULAR | Status: AC
  Filled 2016-12-25: qty 100

## 2016-12-25 MED ORDER — CHLORHEXIDINE GLUCONATE CLOTH 2 % EX PADS: 6.0000 | MEDICATED_PAD | Freq: Once | CUTANEOUS | Status: DC

## 2016-12-25 MED ORDER — LIDOCAINE-EPINEPHRINE 0.5 %-1:200000 IJ SOLN
INTRAMUSCULAR | Status: AC
  Filled 2016-12-25: qty 4

## 2016-12-25 MED ORDER — MIDAZOLAM HCL 2 MG/2ML IJ SOLN: 1.0000 mg | INTRAMUSCULAR | Status: DC | PRN

## 2016-12-25 MED ORDER — FENTANYL CITRATE (PF) 100 MCG/2ML IJ SOLN
INTRAMUSCULAR | Status: AC
  Filled 2016-12-25: qty 2

## 2016-12-25 MED ORDER — MIDAZOLAM HCL 5 MG/5ML IJ SOLN
INTRAMUSCULAR | Status: DC | PRN
  Administered 2016-12-25: 2 mg via INTRAVENOUS

## 2016-12-25 MED ORDER — KETOROLAC TROMETHAMINE 30 MG/ML IJ SOLN
INTRAMUSCULAR | Status: DC | PRN
  Administered 2016-12-25: 30 mg via INTRAVENOUS

## 2016-12-25 MED ORDER — DEXAMETHASONE SODIUM PHOSPHATE 10 MG/ML IJ SOLN
INTRAMUSCULAR | Status: AC
  Filled 2016-12-25: qty 1

## 2016-12-25 MED ORDER — SUCCINYLCHOLINE CHLORIDE 20 MG/ML IJ SOLN
INTRAMUSCULAR | Status: DC | PRN
  Administered 2016-12-25: 50 mg via INTRAVENOUS

## 2016-12-25 MED ORDER — PROPOFOL 10 MG/ML IV BOLUS
INTRAVENOUS | Status: DC | PRN
  Administered 2016-12-25: 100 mg via INTRAVENOUS
  Administered 2016-12-25: 150 mg via INTRAVENOUS

## 2016-12-25 MED ORDER — SUCCINYLCHOLINE CHLORIDE 200 MG/10ML IV SOSY
PREFILLED_SYRINGE | INTRAVENOUS | Status: AC
  Filled 2016-12-25: qty 10

## 2016-12-25 MED ORDER — SODIUM BICARBONATE 4 % IV SOLN
INTRAVENOUS | Status: DC | PRN
  Administered 2016-12-25: 5 mL

## 2016-12-25 MED ORDER — FENTANYL CITRATE (PF) 100 MCG/2ML IJ SOLN: 50.0000 ug | INTRAMUSCULAR | Status: DC | PRN

## 2016-12-25 MED ORDER — EPINEPHRINE 30 MG/30ML IJ SOLN
INTRAMUSCULAR | Status: AC
  Filled 2016-12-25: qty 1

## 2016-12-25 MED ORDER — LIDOCAINE HCL 2 % IJ SOLN
INTRAMUSCULAR | Status: DC | PRN
  Administered 2016-12-25: 100 mL

## 2016-12-25 MED ORDER — PROMETHAZINE HCL 25 MG/ML IJ SOLN
6.2500 mg | INTRAMUSCULAR | Status: DC | PRN
  Administered 2016-12-25: 6.25 mg via INTRAVENOUS

## 2016-12-25 MED ORDER — DEXAMETHASONE SODIUM PHOSPHATE 4 MG/ML IJ SOLN
INTRAMUSCULAR | Status: DC | PRN
  Administered 2016-12-25: 10 mg via INTRAVENOUS

## 2016-12-25 MED ORDER — KETOROLAC TROMETHAMINE 30 MG/ML IJ SOLN
INTRAMUSCULAR | Status: AC
  Filled 2016-12-25: qty 2

## 2016-12-25 MED ORDER — MIDAZOLAM HCL 2 MG/2ML IJ SOLN
INTRAMUSCULAR | Status: AC
  Filled 2016-12-25: qty 2

## 2016-12-25 MED ORDER — SODIUM BICARBONATE 4 % IV SOLN
INTRAVENOUS | Status: AC
  Filled 2016-12-25: qty 5

## 2016-12-25 MED ORDER — ONDANSETRON HCL 4 MG/2ML IJ SOLN
INTRAMUSCULAR | Status: AC
  Filled 2016-12-25: qty 2

## 2016-12-25 MED ORDER — CEFAZOLIN SODIUM-DEXTROSE 2-4 GM/100ML-% IV SOLN
INTRAVENOUS | Status: AC
  Filled 2016-12-25: qty 100

## 2016-12-25 MED ORDER — LIDOCAINE HCL (CARDIAC) 20 MG/ML IV SOLN
INTRAVENOUS | Status: DC | PRN
  Administered 2016-12-25: 30 mg via INTRAVENOUS

## 2016-12-25 MED ORDER — FENTANYL CITRATE (PF) 100 MCG/2ML IJ SOLN
INTRAMUSCULAR | Status: DC | PRN
  Administered 2016-12-25: 100 ug via INTRAVENOUS
  Administered 2016-12-25 (×4): 50 ug via INTRAVENOUS

## 2016-12-25 MED ORDER — PROMETHAZINE HCL 25 MG/ML IJ SOLN
INTRAMUSCULAR | Status: AC
  Filled 2016-12-25: qty 1

## 2016-12-25 MED ORDER — LIDOCAINE 2% (20 MG/ML) 5 ML SYRINGE
INTRAMUSCULAR | Status: AC
  Filled 2016-12-25: qty 5

## 2016-12-25 SURGICAL SUPPLY — 64 items
APL SKNCLS STERI-STRIP NONHPOA (GAUZE/BANDAGES/DRESSINGS) ×2
BAG DECANTER FOR FLEXI CONT (MISCELLANEOUS) ×1 IMPLANT
BENZOIN TINCTURE PRP APPL 2/3 (GAUZE/BANDAGES/DRESSINGS) ×4 IMPLANT
BINDER BREAST XLRG (GAUZE/BANDAGES/DRESSINGS) IMPLANT
BINDER BREAST XXLRG (GAUZE/BANDAGES/DRESSINGS) IMPLANT
BLADE KNIFE  20 PERSONNA (BLADE) ×2
BLADE KNIFE 20 PERSONNA (BLADE) ×2 IMPLANT
BLADE KNIFE PERSONA 10 (BLADE) IMPLANT
BLADE KNIFE PERSONA 15 (BLADE) ×2 IMPLANT
CANISTER SUCT 1200ML W/VALVE (MISCELLANEOUS) ×2 IMPLANT
COVER BACK TABLE 60X90IN (DRAPES) ×2 IMPLANT
COVER MAYO STAND STRL (DRAPES) ×2 IMPLANT
DECANTER SPIKE VIAL GLASS SM (MISCELLANEOUS) ×2 IMPLANT
DRAIN CHANNEL 10F 3/8 F FF (DRAIN) ×4 IMPLANT
DRAPE LAPAROSCOPIC ABDOMINAL (DRAPES) ×2 IMPLANT
DRSG PAD ABDOMINAL 8X10 ST (GAUZE/BANDAGES/DRESSINGS) ×8 IMPLANT
ELECT REM PT RETURN 9FT ADLT (ELECTROSURGICAL) ×2
ELECTRODE REM PT RTRN 9FT ADLT (ELECTROSURGICAL) ×1 IMPLANT
EVACUATOR SILICONE 100CC (DRAIN) ×4 IMPLANT
GAUZE SPONGE 4X4 12PLY STRL (GAUZE/BANDAGES/DRESSINGS) ×4 IMPLANT
GAUZE XEROFORM 5X9 LF (GAUZE/BANDAGES/DRESSINGS) ×4 IMPLANT
GLOVE BIO SURGEON STRL SZ 6.5 (GLOVE) ×2 IMPLANT
GLOVE BIOGEL M STRL SZ7.5 (GLOVE) ×3 IMPLANT
GLOVE BIOGEL PI IND STRL 8 (GLOVE) ×1 IMPLANT
GLOVE BIOGEL PI INDICATOR 8 (GLOVE) ×1
GLOVE ECLIPSE 7.0 STRL STRAW (GLOVE) ×2 IMPLANT
GOWN STRL REUS W/ TWL XL LVL3 (GOWN DISPOSABLE) ×2 IMPLANT
GOWN STRL REUS W/TWL XL LVL3 (GOWN DISPOSABLE) ×4
IV NS 500ML (IV SOLUTION)
IV NS 500ML BAXH (IV SOLUTION) ×1 IMPLANT
MARKER SKIN DUAL TIP RULER LAB (MISCELLANEOUS) ×2 IMPLANT
NDL SPNL 18GX3.5 QUINCKE PK (NEEDLE) ×1 IMPLANT
NEEDLE SPNL 18GX3.5 QUINCKE PK (NEEDLE) ×2 IMPLANT
NS IRRIG 1000ML POUR BTL (IV SOLUTION) ×2 IMPLANT
PACK BASIN DAY SURGERY FS (CUSTOM PROCEDURE TRAY) ×2 IMPLANT
PEN SKIN MARKING BROAD TIP (MISCELLANEOUS) ×2 IMPLANT
PILLOW FOAM RUBBER ADULT (PILLOWS) ×2 IMPLANT
PIN SAFETY STERILE (MISCELLANEOUS) ×2 IMPLANT
SHEETING SILICONE GEL EPI DERM (MISCELLANEOUS) IMPLANT
SLEEVE SCD COMPRESS KNEE MED (MISCELLANEOUS) ×2 IMPLANT
SPECIMEN JAR MEDIUM (MISCELLANEOUS) IMPLANT
SPECIMEN JAR X LARGE (MISCELLANEOUS) ×4 IMPLANT
SPONGE LAP 18X18 X RAY DECT (DISPOSABLE) ×8 IMPLANT
STRIP SUTURE WOUND CLOSURE 1/2 (SUTURE) ×10 IMPLANT
SUT MNCRL AB 3-0 PS2 18 (SUTURE) ×12 IMPLANT
SUT MON AB 2-0 CT1 36 (SUTURE) IMPLANT
SUT MON AB 5-0 PS2 18 (SUTURE) ×4 IMPLANT
SUT PROLENE 3 0 PS 2 (SUTURE) ×12 IMPLANT
SUT VLOC 180 0 24IN GS25 (SUTURE) ×1 IMPLANT
SUT VLOC 90 P-14 23 (SUTURE) ×3 IMPLANT
SYR 50ML LL SCALE MARK (SYRINGE) ×4 IMPLANT
SYR CONTROL 10ML LL (SYRINGE) IMPLANT
TAPE HYPAFIX 6X30 (GAUZE/BANDAGES/DRESSINGS) ×2 IMPLANT
TAPE MEASURE 72IN RETRACT (INSTRUMENTS) ×1
TAPE MEASURE LINEN 72IN RETRCT (INSTRUMENTS) IMPLANT
TAPE PAPER 1X10 WHT MICROPORE (GAUZE/BANDAGES/DRESSINGS) ×2 IMPLANT
TOWEL OR 17X24 6PK STRL BLUE (TOWEL DISPOSABLE) ×4 IMPLANT
TOWEL OR NON WOVEN STRL DISP B (DISPOSABLE) ×2 IMPLANT
TUBE CONNECTING 20X1/4 (TUBING) ×2 IMPLANT
TUBING INFILTRATION IT-10001 (TUBING) ×1 IMPLANT
TUBING SET GRADUATE ASPIR 12FT (MISCELLANEOUS) IMPLANT
UNDERPAD 30X30 (UNDERPADS AND DIAPERS) ×6 IMPLANT
VAC PENCILS W/TUBING CLEAR (MISCELLANEOUS) ×2 IMPLANT
YANKAUER SUCT BULB TIP NO VENT (SUCTIONS) ×2 IMPLANT

## 2016-12-25 NOTE — Transfer of Care (Signed)
Immediate Anesthesia Transfer of Care Note  Patient: Heather BlightChristina B Wade  Procedure(s) Performed: Procedure(s): MAMMARY REDUCTION  (BREAST) BILATERAL (Bilateral)  Patient Location: PACU  Anesthesia Type:General  Level of Consciousness: awake and patient cooperative  Airway & Oxygen Therapy: Patient Spontanous Breathing and Patient connected to face mask oxygen  Post-op Assessment: Report given to RN and Post -op Vital signs reviewed and stable  Post vital signs: Reviewed and stable  Last Vitals:  Vitals:   12/25/16 0656  BP: 132/90  Pulse: 97  Resp: 18  Temp: 36.7 C    Last Pain:  Vitals:   12/25/16 0656  TempSrc: Oral         Complications: No apparent anesthesia complications

## 2016-12-25 NOTE — Op Note (Signed)
NAME:  Heather Wade, Heather Wade             ACCOUNT NO.:  MEDICAL RECORD NO.:  [id number]  LOCATION:                                 FACILITY:  PHYSICIAN:  Oral Hallgren L. Shon Wade, M.D.   DATE OF BIRTH:  DATE OF PROCEDURE:  12/25/2016 DATE OF DISCHARGE:                              OPERATIVE REPORT   INDICATIONS:  This 36 year old lady with severe macromastia, right side greater than left; increased back pain; shoulder pain; intertriginous changes; and accessory breast tissue.  PROCEDURES DONE:  Excision of breast using bilateral reduction mammoplasty with excision of accessory breast tissue, plastic closure.  ANESTHESIA:  General.  Preoperatively, the patient underwent drawings for the reduction mammoplasty using the inferior pedicle technique, remarking the nipple- areolar complex from over 30 cm up to 22.  She then underwent general anesthesia and intubated orally.  Prep was done to the chest and breast areas in routine fashion using Hibiclens soap and solution walled off with sterile towels and drapes so as make a sterile field.  Tumescent solution was injected approximately 500 mL per side and then sit up. The wounds were scored with #15 blade.  The skin of the inferior pedicle was de-epithelialized with #20 blades.  Medial and lateral fatty dermal pedicles were excised down in line of fascia.  New keloid area was also debulked, removal now with 550 g on the left side and over 729 g on the right with good symmetry.  After proper hemostasis, the flaps were transposed and stayed with 3-0 Prolene suture.  Subcutaneous closure was done with 3-0 Monocryl x2 layers and then a running subcuticular stitch of 3-0 Monocryl throughout the inverted T.  the wounds were drained with a #10 fully fluted Blake drains, which were placed in the depths of wound and brought out through the lateral most portion of the incision and secured with 3-0 Prolene.  The wounds were cleansed.  Steri-Strips and  soft dressing applied to all layers.  Nipple-areolar complexes were examined showing excellent blood supply.  4x4s, ABDs, Hypafix tape were placed and a breast garment.  She tolerated the procedures very well. Estimated blood loss less than 100 mL.  Complications none.     Heather Wade, M.D.     Heather Wade  D:  12/25/2016  T:  12/25/2016  Job:  [id number]

## 2016-12-25 NOTE — Anesthesia Preprocedure Evaluation (Signed)
Anesthesia Evaluation  Patient identified by MRN, date of birth, ID band  Reviewed: Allergy & Precautions, NPO status , Patient's Chart, lab work & pertinent test results  Airway Mallampati: II       Dental  (+) Teeth Intact, Dental Advisory Given   Pulmonary asthma , Current Smoker,    breath sounds clear to auscultation       Cardiovascular  Rhythm:Regular Rate:Normal     Neuro/Psych    GI/Hepatic negative GI ROS, Neg liver ROS,   Endo/Other  negative endocrine ROS  Renal/GU negative Renal ROS     Musculoskeletal   Abdominal   Peds  Hematology negative hematology ROS (+)   Anesthesia Other Findings   Reproductive/Obstetrics                             Anesthesia Physical Anesthesia Plan  ASA: II  Anesthesia Plan: General   Post-op Pain Management:    Induction: Intravenous  Airway Management Planned: Oral ETT  Additional Equipment:   Intra-op Plan:   Post-operative Plan: Extubation in OR  Informed Consent: I have reviewed the patients History and Physical, chart, labs and discussed the procedure including the risks, benefits and alternatives for the proposed anesthesia with the patient or authorized representative who has indicated his/her understanding and acceptance.   Dental advisory given  Plan Discussed with:   Anesthesia Plan Comments:         Anesthesia Quick Evaluation

## 2016-12-25 NOTE — Discharge Instructions (Signed)
Breast Reduction Care After Refer to this sheet in the next few weeks. These instructions provide you with information on caring for yourself after your procedure. Your caregiver may also give you more specific instructions. Your treatment has been planned according to current medical practices, but problems sometimes occur. Call your caregiver if you have any problems or questions after your procedure. HOME CARE INSTRUCTIONS  Do not lift more than 5 pounds with one arm, or 10 pounds with both arms, for 1 month.   Do not sleep on your stomach for 4 to 6 weeks.   Do not do vigorous exercise such as bouncing, aerobics, or jumping for 6 weeks. Walking is not restricted.   Do not drive while you are taking prescription pain medicine.   Avoid prolonged sun exposure.   Keep dressings dry and clean  Measure jp drainage every 12 hrs and measure   You may slowly go back to your normal diet. Start with a light meal and increase as comfortable.   You may shower 24 hours after your drains are removed unless instructed differently by your caregiver.   Take your pain medicine as prescribed. Discomfort is normal after breast reduction surgery.   Keep the head of your bed elevated 40 degrees   : Call the office if you notice:  You have a fever.   You notice drainage from the incision that smells bad.   You have persistent pain.   You have persistent bleeding from the incision or nipple discharge.   You develop increased swelling or swelling that is greater in one breast than in the other.  MAKE SURE YOU:   Understand these instructions.   Will watch your condition.   Will get help right away if you are not doing well or get worse.  Document Released: 05/09/2004 Document Revised: 06/07/2011 Document Reviewed: 12/19/2007 [facility] Patient Information 2012 [place], [place].Breast Reduction Care After Refer to this sheet in the next few weeks. These instructions provide you with  information on caring for yourself after your procedure. Your caregiver may also give you more specific instructions. Your treatment has been planned according to current medical practices, but problems sometimes occur. Call your caregiver if you have any problems or questions after your procedure. HOME CARE INSTRUCTIONS  Do not lift more than 5 pounds with one arm, or 10 pounds with both arms, for 1 month.   Do not sleep on your stomach for 4 to 6 weeks.   Do not do vigorous exercise such as bouncing, aerobics, or jumping for 6 weeks. Walking is not restricted.   Do not drive while you are taking prescription pain medicine.   Avoid prolonged sun exposure.   Keep dressings dry and clean  Measure jp drainage every 12 hrs and measure   You may slowly go back to your normal diet. Start with a light meal and increase as comfortable.   You may shower 24 hours after your drains are removed unless instructed differently by your caregiver.   Take your pain medicine as prescribed. Discomfort is normal after breast reduction surgery.   Keep the head of your bed elevated 40 degrees   : Call the office if you notice:  You have a fever.   You notice drainage from the incision that smells bad.   You have persistent pain.   You have persistent bleeding from the incision or nipple discharge.   You develop increased swelling or swelling that is greater in one breast than in the  other.  MAKE SURE YOU:   Understand these instructions.   Will watch your condition.   Will get help right away if you are not doing well or get worse.  Document Released: 05/09/2004 Document Revised: 06/07/2011 Document Reviewed: 12/19/2007 [facility] Patient Information 2012 [place], [place].       Post Anesthesia Home Care Instructions  Activity: Get plenty of rest for the remainder of the day. A responsible adult should stay with you for 24 hours following the procedure.  For the next 24 hours, DO  NOT: -Drive a car -Advertising copywriter -Drink alcoholic beverages -Take any medication unless instructed by your physician -Make any legal decisions or sign important papers.  Meals: Start with liquid foods such as gelatin or soup. Progress to regular foods as tolerated. Avoid greasy, spicy, heavy foods. If nausea and/or vomiting occur, drink only clear liquids until the nausea and/or vomiting subsides. Call your physician if vomiting continues.  Special Instructions/Symptoms: Your throat may feel dry or sore from the anesthesia or the breathing tube placed in your throat during surgery. If this causes discomfort, gargle with warm salt water. The discomfort should disappear within 24 hours.  If you had a scopolamine patch placed behind your ear for the management of post- operative nausea and/or vomiting:  1. The medication in the patch is effective for 72 hours, after which it should be removed.  Wrap patch in a tissue and discard in the trash. Wash hands thoroughly with soap and water. 2. You may remove the patch earlier than 72 hours if you experience unpleasant side effects which may include dry mouth, dizziness or visual disturbances. 3. Avoid touching the patch. Wash your hands with soap and water after contact with the patch.         JP Drain [employer] this sheet to all of your post-operative appointments while you have your drains.  Please measure your drains by CC's or ML's.  Make sure you drain and measure your JP Drains 2 or 3 times per day.  At the end of each day, add up totals for the left side and add up totals for the right side.    ( 9 am )     ( 3 pm )        ( 9 pm )                Date L  R  L  R  L  R  Total L/R

## 2016-12-25 NOTE — Anesthesia Postprocedure Evaluation (Addendum)
Anesthesia Post Note  Patient: Heather Wade  Procedure(s) Performed: Procedure(s) (LRB): MAMMARY REDUCTION  (BREAST) BILATERAL (Bilateral)  Patient location during evaluation: PACU Anesthesia Type: General Level of consciousness: sedated and awake Pain management: pain level controlled Vital Signs Assessment: post-procedure vital signs reviewed and stable Respiratory status: spontaneous breathing, nonlabored ventilation, respiratory function stable and patient connected to nasal cannula oxygen Cardiovascular status: blood pressure returned to baseline and stable Postop Assessment: no signs of nausea or vomiting Anesthetic complications: no       Last Vitals:  Vitals:   12/25/16 1039 12/25/16 1110  BP:  (!) 151/89  Pulse: 76 75  Resp: 17 18  Temp:  36.7 C    Last Pain:  Vitals:   12/25/16 1110  TempSrc:   PainSc: 0-No pain                 Teresita Fanton,JAMES TERRILL

## 2016-12-25 NOTE — Brief Op Note (Signed)
12/25/2016  9:53 AM  PATIENT:  Marinell Blighthristina B Loja  36 y.o. female  PRE-OPERATIVE DIAGNOSIS:  macromastia  POST-OPERATIVE DIAGNOSIS:  macromastia  PROCEDURE:  Procedure(s): MAMMARY REDUCTION  (BREAST) BILATERAL (Bilateral)  SURGEON:  Surgeon(s) and Role:    * Louisa SecondGerald Lorrie Strauch, MD - Primary  PHYSICIAN ASSISTANT:   ASSISTANTS: none   ANESTHESIA:   general  EBL:  Total I/O In: 1500 [I.V.:1500] Out: -   BLOOD ADMINISTERED:none  DRAINS: (cc) Jackson-Pratt drain(s) with closed bulb suction in the 10 JP   LOCAL MEDICATIONS USED:  LIDOCAINE   SPECIMEN:  Excision  DISPOSITION OF SPECIMEN:  PATHOLOGY  COUNTS:  YES  TOURNIQUET:  * No tourniquets in log *  DICTATION: .Other Dictation: Dictation Number [id number]  PLAN OF CARE: Discharge to home after PACU  PATIENT DISPOSITION:  PACU - hemodynamically stable.   Delay start of Pharmacological VTE agent (>24hrs) due to surgical blood loss or risk of bleeding: yes

## 2016-12-25 NOTE — H&P (Signed)
Heather BlightChristina B Wade is an 36 y.o. female.   Chief Complaint:Severe macromastia HPI: Back shoulder pain and severe intertrigo  Past Medical History:  Diagnosis Date  . Asthma    but has never had an asthma attack  . Bronchitis   . PONV (postoperative nausea and vomiting)     Past Surgical History:  Procedure Laterality Date  . SHOULDER SURGERY Right 2011    Family History  Problem Relation Age of Onset  . Cancer Mother   . Cancer Maternal Grandmother   . Cancer Maternal Grandfather   . Cancer Paternal Grandmother   . Cancer Paternal Grandfather    Social History:  reports that she has been smoking.  She has never used smokeless tobacco. She reports that she drinks alcohol. She reports that she does not use drugs.  Allergies:  Allergies  Allergen Reactions  . Metronidazole Hives    Medications Prior to Admission  Medication Sig Dispense Refill  . albuterol (PROVENTIL HFA;VENTOLIN HFA) 108 (90 BASE) MCG/ACT inhaler Inhale 2 puffs into the lungs every 4 (four) hours as needed. For shortness of breath.     . budesonide-formoterol (SYMBICORT) 160-4.5 MCG/ACT inhaler Inhale 2 puffs into the lungs 2 (two) times daily.      Marland Kitchen. ibuprofen (ADVIL,MOTRIN) 200 MG tablet Take 200 mg by mouth every 6 (six) hours as needed for pain.    . meloxicam (MOBIC) 15 MG tablet Take 15 mg by mouth daily.      No results found for this or any previous visit (from the past 48 hour(s)). No results found.  Review of Systems  Constitutional: Negative.   HENT: Negative.   Eyes: Negative.   Respiratory: Negative.   Cardiovascular: Negative.   Gastrointestinal: Negative.   Genitourinary: Negative.   Musculoskeletal: Positive for back pain and neck pain.  Skin: Positive for rash.  Endo/Heme/Allergies: Negative.   Psychiatric/Behavioral: Negative.     Blood pressure 132/90, pulse 97, temperature 98 F (36.7 C), temperature source Oral, resp. rate 18, height 5\' 4"  (1.626 m), weight 66.5 kg  (146 lb 8 oz), SpO2 100 %. Physical Exam   Assessment/Plansevere macromastia for bilaqteral breast reductions and excision of accessory breast tissue  Lionel Woodberry L, MD 12/25/2016, 7:11 AM

## 2016-12-25 NOTE — Anesthesia Procedure Notes (Signed)
Procedure Name: Intubation Date/Time: 12/25/2016 7:53 AM Performed by: [place] DesanctisLINKA, Sulma Ruffino L Pre-anesthesia Checklist: Patient identified, Emergency Drugs available, Suction available, Patient being monitored and Timeout performed Patient Re-evaluated:Patient Re-evaluated prior to inductionOxygen Delivery Method: Circle system utilized Preoxygenation: Pre-oxygenation with 100% oxygen Intubation Type: IV induction Ventilation: Mask ventilation without difficulty Laryngoscope Size: Miller and 3 Grade View: Grade II Tube type: Oral Tube size: 7.0 mm Number of attempts: 1 Airway Equipment and Method: Stylet and Oral airway Placement Confirmation: ETT inserted through vocal cords under direct vision,  positive ETCO2 and breath sounds checked- equal and bilateral Secured at: 20 cm Tube secured with: Tape Dental Injury: Teeth and Oropharynx as per pre-operative assessment

## 2016-12-26 ENCOUNTER — Encounter (HOSPITAL_BASED_OUTPATIENT_CLINIC_OR_DEPARTMENT_OTHER): Payer: Self-pay | Admitting: Specialist

## 2017-03-09 NOTE — Addendum Note (Signed)
Addendum  created 03/09/17 1230 by Jocelyne Reinertsen, MD   Sign clinical note    

## 2019-08-18 ENCOUNTER — Encounter (HOSPITAL_COMMUNITY): Payer: Self-pay | Admitting: Emergency Medicine

## 2019-08-18 ENCOUNTER — Emergency Department (HOSPITAL_COMMUNITY)
Admission: EM | Admit: 2019-08-18 | Discharge: 2019-08-18 | Disposition: A | Discharge status: Home / Self Care | Payer: Medicaid Other | Attending: Emergency Medicine | Admitting: Emergency Medicine

## 2019-08-18 ENCOUNTER — Emergency Department (HOSPITAL_COMMUNITY): Payer: Medicaid Other

## 2019-08-18 ENCOUNTER — Other Ambulatory Visit: Payer: Self-pay

## 2019-08-18 DIAGNOSIS — I493 Ventricular premature depolarization: Secondary | ICD-10-CM | POA: Diagnosis not present

## 2019-08-18 DIAGNOSIS — Z79899 Other long term (current) drug therapy: Secondary | ICD-10-CM | POA: Insufficient documentation

## 2019-08-18 DIAGNOSIS — F419 Anxiety disorder, unspecified: Secondary | ICD-10-CM | POA: Diagnosis not present

## 2019-08-18 DIAGNOSIS — R002 Palpitations: Secondary | ICD-10-CM

## 2019-08-18 DIAGNOSIS — R079 Chest pain, unspecified: Secondary | ICD-10-CM | POA: Insufficient documentation

## 2019-08-18 DIAGNOSIS — J45909 Unspecified asthma, uncomplicated: Secondary | ICD-10-CM | POA: Insufficient documentation

## 2019-08-18 DIAGNOSIS — F17200 Nicotine dependence, unspecified, uncomplicated: Secondary | ICD-10-CM | POA: Insufficient documentation

## 2019-08-18 LAB — BASIC METABOLIC PANEL
Anion gap: 9 (ref 5–15)
BUN: 10 mg/dL (ref 6–20)
CO2: 24 mmol/L (ref 22–32)
Calcium: 8.8 mg/dL — ABNORMAL LOW (ref 8.9–10.3)
Chloride: 106 mmol/L (ref 98–111)
Creatinine, Ser: 0.61 mg/dL (ref 0.44–1.00)
GFR calc Af Amer: >60 mL/min (ref >60)
GFR calc non Af Amer: >60 mL/min (ref >60)
Glucose, Bld: 106 mg/dL — ABNORMAL HIGH (ref 70–99)
Potassium: 3.5 mmol/L (ref 3.5–5.1)
Sodium: 139 mmol/L (ref 135–145)

## 2019-08-18 LAB — CBC
HCT: 45.2 % (ref 36.0–46.0)
Hemoglobin: 15 g/dL (ref 12.0–15.0)
MCH: 33.5 pg (ref 26.0–34.0)
MCHC: 33.2 g/dL (ref 30.0–36.0)
MCV: 100.9 fL — ABNORMAL HIGH (ref 80.0–100.0)
Platelets: 250 10*3/uL (ref 150–400)
RBC: 4.48 MIL/uL (ref 3.87–5.11)
RDW: 13.2 % (ref 11.5–15.5)
WBC: 9.9 10*3/uL (ref 4.0–10.5)
nRBC: 0 % (ref 0.0–0.2)

## 2019-08-18 LAB — I-STAT BETA HCG BLOOD, ED (NOT ORDERABLE): I-stat hCG, quantitative: <5 m[IU]/mL (ref <5)

## 2019-08-18 MED ORDER — SODIUM CHLORIDE 0.9% FLUSH: 3.0000 mL | Freq: Once | INTRAVENOUS | Status: DC

## 2019-08-18 NOTE — Discharge Instructions (Signed)
Please contact your primary care doctor if the palpitations persist.  Perhaps he might need Holter monitoring. If you do think there is an element of anxiety associated with your palpitations then please take the Xanax that was prescribed to see if it helps.

## 2019-08-18 NOTE — ED Provider Notes (Signed)
[facility] DEPT Provider Note   CSN: [id number] Arrival date & time: 08/18/19  0840     History   Chief Complaint Chief Complaint  Patient presents with  . Palpitations    HPI Heather Wade is a 38 y.o. female.     HPI  38 year old female comes in a chief complaint of palpitations. Patient has history of asthma, bronchitis and reports that she has been having more frequent palpitations over the past few days.  She has had palpitations long time ago but they were due to anxiety.  She now gets a few episodes every hour that last about 5 minutes which are unprovoked.  Patient gets associated chest tightness, but she denies any shortness of breath, dizziness.  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  She also wants to make sure that we can minimize her attacks if possible.  Pt has no hx of PE, DVT and denies any exogenous hormone (testosterone / estrogen) use, long distance travels or surgery in the past 6 weeks, active cancer, recent immobilization.  She denies any drug use, premature CAD or heart attack in the family.  Past Medical History:  Diagnosis Date  . Asthma    but has never had an asthma attack  . Bronchitis   . PONV (postoperative nausea and vomiting)     Patient Active Problem List   Diagnosis Date Noted  . External hemorrhoid 04/06/2014  . CHRONIC RHINITIS 07/20/2009  . ASTHMA, UNSPECIFIED 07/05/2009  . COUGH 07/05/2009    Past Surgical History:  Procedure Laterality Date  . BREAST REDUCTION SURGERY Bilateral 12/25/2016   Procedure: MAMMARY REDUCTION  (BREAST) BILATERAL;  Surgeon: Cristine Polio, MD;  Location: [facility];  Service: Plastics;  Laterality: Bilateral;  . SHOULDER SURGERY Right 2011     OB History   No obstetric history on file.      Home Medications    Prior to Admission medications   Medication Sig Start Date End  Date Taking? Authorizing Provider  albuterol (PROVENTIL HFA;VENTOLIN HFA) 108 (90 BASE) MCG/ACT inhaler Inhale 2 puffs into the lungs every 4 (four) hours as needed. For shortness of breath.     [provider]  budesonide-formoterol (SYMBICORT) 160-4.5 MCG/ACT inhaler Inhale 2 puffs into the lungs 2 (two) times daily.      [provider]  meloxicam (MOBIC) 15 MG tablet Take 15 mg by mouth daily.    [provider]    Family History Family History  Problem Relation Age of Onset  . Cancer Mother   . Cancer Maternal Grandmother   . Cancer Maternal Grandfather   . Cancer Paternal Grandmother   . Cancer Paternal Grandfather     Social History Social History   Tobacco Use  . Smoking status: Light Tobacco Smoker  . Smokeless tobacco: Never Used  Substance Use Topics  . Alcohol use: Yes    Comment: occasional drinker  . Drug use: No     Allergies   Metronidazole   Review of Systems Review of Systems  Constitutional: Positive for activity change.  Respiratory: Negative for shortness of breath.   Cardiovascular: Positive for chest pain and palpitations.  Neurological: Negative for dizziness.  All other systems reviewed and are negative.    Physical Exam Updated Vital Signs BP (!) 134/104 (BP Location: Left Arm)   Pulse 63   Temp 98.3 F (36.8 C) (Oral)  Resp 16   SpO2 100%   Physical Exam Vitals signs and nursing note reviewed.  Constitutional:      Appearance: She is well-developed.  HENT:     Head: Normocephalic and atraumatic.  Neck:     Musculoskeletal: Normal range of motion and neck supple.  Cardiovascular:     Rate and Rhythm: Normal rate.  Pulmonary:     Effort: Pulmonary effort is normal.  Abdominal:     General: Bowel sounds are normal.  Skin:    General: Skin is warm and dry.  Neurological:     Mental Status: She is alert and oriented to person, place, and time.      ED Treatments / Results  Labs (all labs  ordered are listed, but only abnormal results are displayed) Labs Reviewed  BASIC METABOLIC PANEL - Abnormal; Notable for the following components:      Result Value   Glucose, Bld 106 (*)    Calcium 8.8 (*)    All other components within normal limits  CBC - Abnormal; Notable for the following components:   MCV 100.9 (*)    All other components within normal limits  I-STAT BETA HCG BLOOD, ED ([facility], [facility], [facility] ONLY)  I-STAT BETA HCG BLOOD, ED (NOT ORDERABLE)    EKG EKG Interpretation  Date/Time:  Monday August 18 2019 09:02:00 EST Ventricular Rate:  73 PR Interval:    QRS Duration: 103 QT Interval:  405 QTC Calculation: 447 R Axis:   36 Text Interpretation: Sinus arrhythmia Ventricular premature complex Low voltage, extremity and precordial leads Baseline wander in lead(s) V1 V4 V5 one PVC otherwise normal, no old comparison Confirmed by Arby Barrette [phone]) on 08/18/2019 9:25:49 AM   Radiology Dg Chest 2 View  Result Date: 08/18/2019 CLINICAL DATA:  Chest palpitations and discomfort for 2 days. EXAM: CHEST - 2 VIEW COMPARISON:  PA and lateral chest 11/18/2011. FINDINGS: The lungs are clear. Heart size is normal. No pneumothorax or pleural fluid. No acute or focal bony abnormality. IMPRESSION: Negative chest. Electronically Signed   By: Drusilla Kanner M.D.   On: 08/18/2019 09:36    Procedures Procedures (including critical care time)  Medications Ordered in ED Medications  sodium chloride flush (NS) 0.9 % injection 3 mL (has no administration in time range)     Initial Impression / Assessment and Plan / ED Course  I have reviewed the triage vital signs and the nursing notes.  Pertinent labs & imaging results that were available during my care of the patient were reviewed by me and considered in my medical decision making (see chart for details).        38 year old female comes into the ER with chief complaint of palpitations.  She does not have any underlying  cardiac issues.  she does have a history of anxiety, but thinks that the palpitations is causing her to be anxious and not the other way around.  On EKG she has PVC.  History and exam not suggestive of any underlying concerning process.  We will keep her on the telemetry for a short while and reassess.  Labs have been ordered to make sure there is no metabolic derangement.  Final Clinical Impressions(s) / ED Diagnoses   Final diagnoses:  None    ED Discharge Orders    None       Derwood Kaplan, MD 08/19/19 [phone]

## 2019-08-18 NOTE — ED Notes (Signed)
Patient has a extra gold and blue top in the main lab °

## 2019-08-18 NOTE — ED Triage Notes (Signed)
Patient here from home with complaints of palpitations that started today. Denies pain. Denis n/v.

## 2020-03-03 ENCOUNTER — Emergency Department (HOSPITAL_COMMUNITY)
Admission: EM | Admit: 2020-03-03 | Discharge: 2020-03-03 | Disposition: A | Discharge status: Home / Self Care | Payer: Medicaid Other | Attending: Emergency Medicine | Admitting: Emergency Medicine

## 2020-03-03 ENCOUNTER — Other Ambulatory Visit: Payer: Self-pay

## 2020-03-03 DIAGNOSIS — Z0279 Encounter for issue of other medical certificate: Secondary | ICD-10-CM | POA: Diagnosis present

## 2020-03-03 DIAGNOSIS — Z5321 Procedure and treatment not carried out due to patient leaving prior to being seen by health care provider: Secondary | ICD-10-CM | POA: Insufficient documentation

## 2020-03-03 NOTE — ED Notes (Signed)
I called patient in the lobby and and outside for a room and no one responded

## 2020-03-03 NOTE — ED Triage Notes (Signed)
Pt says that she needs a covid test to go back to work. Reporting that she has had nasal congestion, cough and sneezing since Monday. Recent travel from [place]

## 2020-03-03 NOTE — ED Notes (Signed)
I called patient Heather Wade in the lobby and outside for a room and no one responded

## 2020-03-12 ENCOUNTER — Other Ambulatory Visit: Payer: Self-pay

## 2020-03-12 ENCOUNTER — Ambulatory Visit (INDEPENDENT_AMBULATORY_CARE_PROVIDER_SITE_OTHER): Payer: Medicaid Other

## 2020-03-12 ENCOUNTER — Ambulatory Visit (HOSPITAL_COMMUNITY)
Admission: EM | Admit: 2020-03-12 | Discharge: 2020-03-12 | Disposition: A | Discharge status: Home / Self Care | Payer: Medicaid Other | Attending: Family Medicine | Admitting: Family Medicine

## 2020-03-12 ENCOUNTER — Encounter (HOSPITAL_COMMUNITY): Payer: Self-pay

## 2020-03-12 DIAGNOSIS — R05 Cough: Secondary | ICD-10-CM | POA: Diagnosis not present

## 2020-03-12 DIAGNOSIS — F17200 Nicotine dependence, unspecified, uncomplicated: Secondary | ICD-10-CM | POA: Diagnosis not present

## 2020-03-12 DIAGNOSIS — J4541 Moderate persistent asthma with (acute) exacerbation: Secondary | ICD-10-CM

## 2020-03-12 DIAGNOSIS — R0602 Shortness of breath: Secondary | ICD-10-CM | POA: Diagnosis present

## 2020-03-12 DIAGNOSIS — Z79899 Other long term (current) drug therapy: Secondary | ICD-10-CM | POA: Diagnosis not present

## 2020-03-12 DIAGNOSIS — J22 Unspecified acute lower respiratory infection: Secondary | ICD-10-CM

## 2020-03-12 DIAGNOSIS — Z881 Allergy status to other antibiotic agents status: Secondary | ICD-10-CM | POA: Diagnosis not present

## 2020-03-12 DIAGNOSIS — Z20822 Contact with and (suspected) exposure to covid-19: Secondary | ICD-10-CM | POA: Diagnosis not present

## 2020-03-12 MED ORDER — AMOXICILLIN-POT CLAVULANATE 875-125 MG PO TABS: 1.0000 | ORAL_TABLET | Freq: Two times a day (BID) | ORAL | 0 refills | Status: DC

## 2020-03-12 MED ORDER — BUDESONIDE-FORMOTEROL FUMARATE 160-4.5 MCG/ACT IN AERO: 2.0000 | INHALATION_SPRAY | Freq: Two times a day (BID) | RESPIRATORY_TRACT | 0 refills | Status: AC

## 2020-03-12 MED ORDER — PREDNISONE 20 MG PO TABS: 20.0000 mg | ORAL_TABLET | Freq: Two times a day (BID) | ORAL | 0 refills | Status: DC

## 2020-03-12 MED ORDER — ALBUTEROL SULFATE HFA 108 (90 BASE) MCG/ACT IN AERS: 2.0000 | INHALATION_SPRAY | RESPIRATORY_TRACT | 0 refills | Status: DC | PRN

## 2020-03-12 NOTE — ED Triage Notes (Signed)
Pt states she was evaluated and tested for COVID (at [facility]) approx 2 weeks for cough, runny nose and her test was negative. Pt states she was Rx prednisone and helped cough while she was taking it. Reports that cough worsened after prednisone Rx ended. Pt states she now has productive cough with green sputum, nasal secretions of green color and increased SOB that is temporarily improved with albuterol nebulizer.   Also c/o sore throat, HA, chills.  Denies abdom pain, n/v/d.   Bilat wheezes with slight diminished lung sounds. Last nebulizer tx was last night; last MDI inhaler this morning at approx 0800.  Out of symbicort 2/2 insurance.

## 2020-03-12 NOTE — ED Notes (Signed)
Advised pt we are awaiting radiology interpretation of CXR; warm blankets given for comfort.

## 2020-03-12 NOTE — ED Provider Notes (Addendum)
[facility]    CSN: [id number] Arrival date & time: 03/12/20  3846      History   Chief Complaint Chief Complaint  Patient presents with  . Shortness of Breath    HPI Heather Wade is a 39 y.o. female.   HPI  Patient has known asthma She is out of her Symbicort because of insurance problems She [facility] for cough.  Was treated with prednisone.  She saw some improvement.  Her coronavirus test at that time was negative. She is here because she started having wheezing again soon after the prednisone completed.  Now she has thick yellow mucus, green mucus from her sinuses and from her chest.  Feels more short of breath.  Is having fatigue and chills.  Is worried about pneumonia She is using her albuterol inhaler and nebulizer States she is drinking plenty of fluids No known exposure to illness No loss of taste or smell  Past Medical History:  Diagnosis Date  . Asthma    but has never had an asthma attack  . Bronchitis   . PONV (postoperative nausea and vomiting)     Patient Active Problem List   Diagnosis Date Noted  . External hemorrhoid 04/06/2014  . CHRONIC RHINITIS 07/20/2009  . ASTHMA, UNSPECIFIED 07/05/2009  . COUGH 07/05/2009    Past Surgical History:  Procedure Laterality Date  . BREAST REDUCTION SURGERY Bilateral 12/25/2016   Procedure: MAMMARY REDUCTION  (BREAST) BILATERAL;  Surgeon: Louisa Second, MD;  Location: [facility];  Service: Plastics;  Laterality: Bilateral;  . SHOULDER SURGERY Right 2011    OB History   No obstetric history on file.      Home Medications    Prior to Admission medications   Medication Sig Start Date End Date Taking? Authorizing Provider  albuterol (VENTOLIN HFA) 108 (90 Base) MCG/ACT inhaler Inhale 2 puffs into the lungs every 4 (four) hours as needed. For shortness of breath. 03/12/20   Eustace Moore, MD  ALPRAZolam Prudy Feeler) 0.25 MG tablet Take  0.125-0.25 mg by mouth 2 (two) times daily as needed for anxiety. 07/07/19   [provider]  amoxicillin-clavulanate (AUGMENTIN) 875-125 MG tablet Take 1 tablet by mouth every 12 (twelve) hours. 03/12/20   Eustace Moore, MD  budesonide-formoterol [facility]) 160-4.5 MCG/ACT inhaler Inhale 2 puffs into the lungs 2 (two) times daily. 03/12/20   Eustace Moore, MD  predniSONE (DELTASONE) 20 MG tablet Take 1 tablet (20 mg total) by mouth 2 (two) times daily with a meal. 03/12/20   Eustace Moore, MD    Family History Family History  Problem Relation Age of Onset  . Cancer Mother   . Cancer Maternal Grandmother   . Cancer Maternal Grandfather   . Cancer Paternal Grandmother   . Cancer Paternal Grandfather     Social History Social History   Tobacco Use  . Smoking status: Light Tobacco Smoker  . Smokeless tobacco: Never Used  Substance Use Topics  . Alcohol use: Yes    Comment: occasional drinker  . Drug use: No     Allergies   Metronidazole   Review of Systems Review of Systems  Constitutional: Positive for chills and fatigue.  HENT: Positive for congestion and rhinorrhea.   Respiratory: Positive for cough, shortness of breath and wheezing.   Neurological: Negative for headaches.     Physical Exam Triage Vital Signs ED Triage Vitals [03/12/20 0909]  Enc Vitals Group  BP (!) 139/94     Pulse Rate (!) 106     Resp (!) 21     Temp 99.3 F (37.4 C)     Temp Source Oral     SpO2 97 %     Weight      Height      Head Circumference      Peak Flow      Pain Score      Pain Loc      Pain Edu?      Excl. in GC?    No data found.  Updated Vital Signs BP (!) 139/94 (BP Location: Left Arm)   Pulse (!) 106   Temp 99.3 F (37.4 C) (Oral)   Resp (!) 21   SpO2 97%      Physical Exam Constitutional:      General: She is not in acute distress.    Appearance: She is well-developed and normal weight. She is ill-appearing.     Comments: Appears  tired, moderately ill  HENT:     Head: Normocephalic and atraumatic.     Mouth/Throat:     Mouth: Mucous membranes are moist.     Comments: Posterior pharynx injected with no exudate Eyes:     Conjunctiva/sclera: Conjunctivae normal.     Pupils: Pupils are equal, round, and reactive to light.  Cardiovascular:     Rate and Rhythm: Regular rhythm. Tachycardia present.  Pulmonary:     Effort: Pulmonary effort is normal. No respiratory distress.     Breath sounds: Examination of the right-upper field reveals wheezing. Examination of the right-middle field reveals wheezing. Examination of the right-lower field reveals wheezing. Wheezing present. No decreased breath sounds, rhonchi or rales.  Chest:     Chest wall: No tenderness or crepitus.  Abdominal:     General: There is no distension.  Musculoskeletal:        General: Normal range of motion.     Cervical back: Normal range of motion.     Right lower leg: No edema.     Left lower leg: No edema.  Lymphadenopathy:     Cervical: No cervical adenopathy.  Skin:    General: Skin is warm and dry.  Neurological:     Mental Status: She is alert.  Psychiatric:        Mood and Affect: Mood normal.        Behavior: Behavior normal.      UC Treatments / Results  Labs (all labs ordered are listed, but only abnormal results are displayed) Labs Reviewed  SARS CORONAVIRUS 2 (TAT 6-24 HRS)    EKG   Radiology DG Chest 2 View  Result Date: 03/12/2020 CLINICAL DATA:  Cough. EXAM: CHEST - 2 VIEW COMPARISON:  August 18, 2019. FINDINGS: The heart size and mediastinal contours are within normal limits. No pneumothorax or pleural effusion is noted. Left lung is clear. Minimal right midlung subsegmental atelectasis or scarring is noted. The visualized skeletal structures are unremarkable. IMPRESSION: Minimal right midlung subsegmental atelectasis or scarring. Electronically Signed   By: Lupita Raider M.D.   On: 03/12/2020 10:35    Radiology  results not yet available in epic A copy of the print out was faxed over from [facility] radiology Patient does not have any cardiovascular disease or pneumonia Slight atelectasis right  Procedures Procedures (including critical care time)  Medications Ordered in UC Medications - No data to display  Initial Impression / Assessment and Plan / UC Course  I have reviewed the triage vital signs and the nursing notes.  Pertinent labs & imaging results that were available during my care of the patient were reviewed by me and considered in my medical decision making (see chart for details).      Final Clinical Impressions(s) / UC Diagnoses   Final diagnoses:  Moderate persistent asthma with acute exacerbation  LRTI (lower respiratory tract infection)     Discharge Instructions     Take the antibiotic 2 times a day Take 2 doses today Take a probiotic while you are on this antibiotic to prevent stomach upset I have refilled the prednisone to take for 5 days This will help with the wheezing and cough I have refilled your Symbicort.  According to my records it is covered by [facility] insurance Follow-up with your primary care doctor    ED Prescriptions    Medication Sig Dispense Auth. Provider   budesonide-formoterol (SYMBICORT) 160-4.5 MCG/ACT inhaler Inhale 2 puffs into the lungs 2 (two) times daily. 1 Inhaler Raylene Everts, MD   predniSONE (DELTASONE) 20 MG tablet Take 1 tablet (20 mg total) by mouth 2 (two) times daily with a meal. 10 tablet Raylene Everts, MD   amoxicillin-clavulanate (AUGMENTIN) 875-125 MG tablet Take 1 tablet by mouth every 12 (twelve) hours. 14 tablet Raylene Everts, MD   albuterol (VENTOLIN HFA) 108 (90 Base) MCG/ACT inhaler Inhale 2 puffs into the lungs every 4 (four) hours as needed. For shortness of breath. 18 g Raylene Everts, MD     PDMP not reviewed this encounter.   Raylene Everts, MD 03/12/20 1554    Raylene Everts,  MD 03/12/20 1850

## 2020-03-12 NOTE — Discharge Instructions (Addendum)
Take the antibiotic 2 times a day Take 2 doses today Take a probiotic while you are on this antibiotic to prevent stomach upset I have refilled the prednisone to take for 5 days This will help with the wheezing and cough I have refilled your Symbicort.  According to my records it is covered by [facility] insurance Follow-up with your primary care doctor

## 2020-03-13 LAB — SARS CORONAVIRUS 2 (TAT 6-24 HRS): SARS Coronavirus 2: NEGATIVE

## 2020-11-04 IMAGING — DX DG CHEST 2V
2 series · 2 of 2 positions shown · non-contrast
Comparison: August 18, 2019.

CLINICAL DATA: Cough.

EXAM:
CHEST - 2 VIEW

[chest pa]
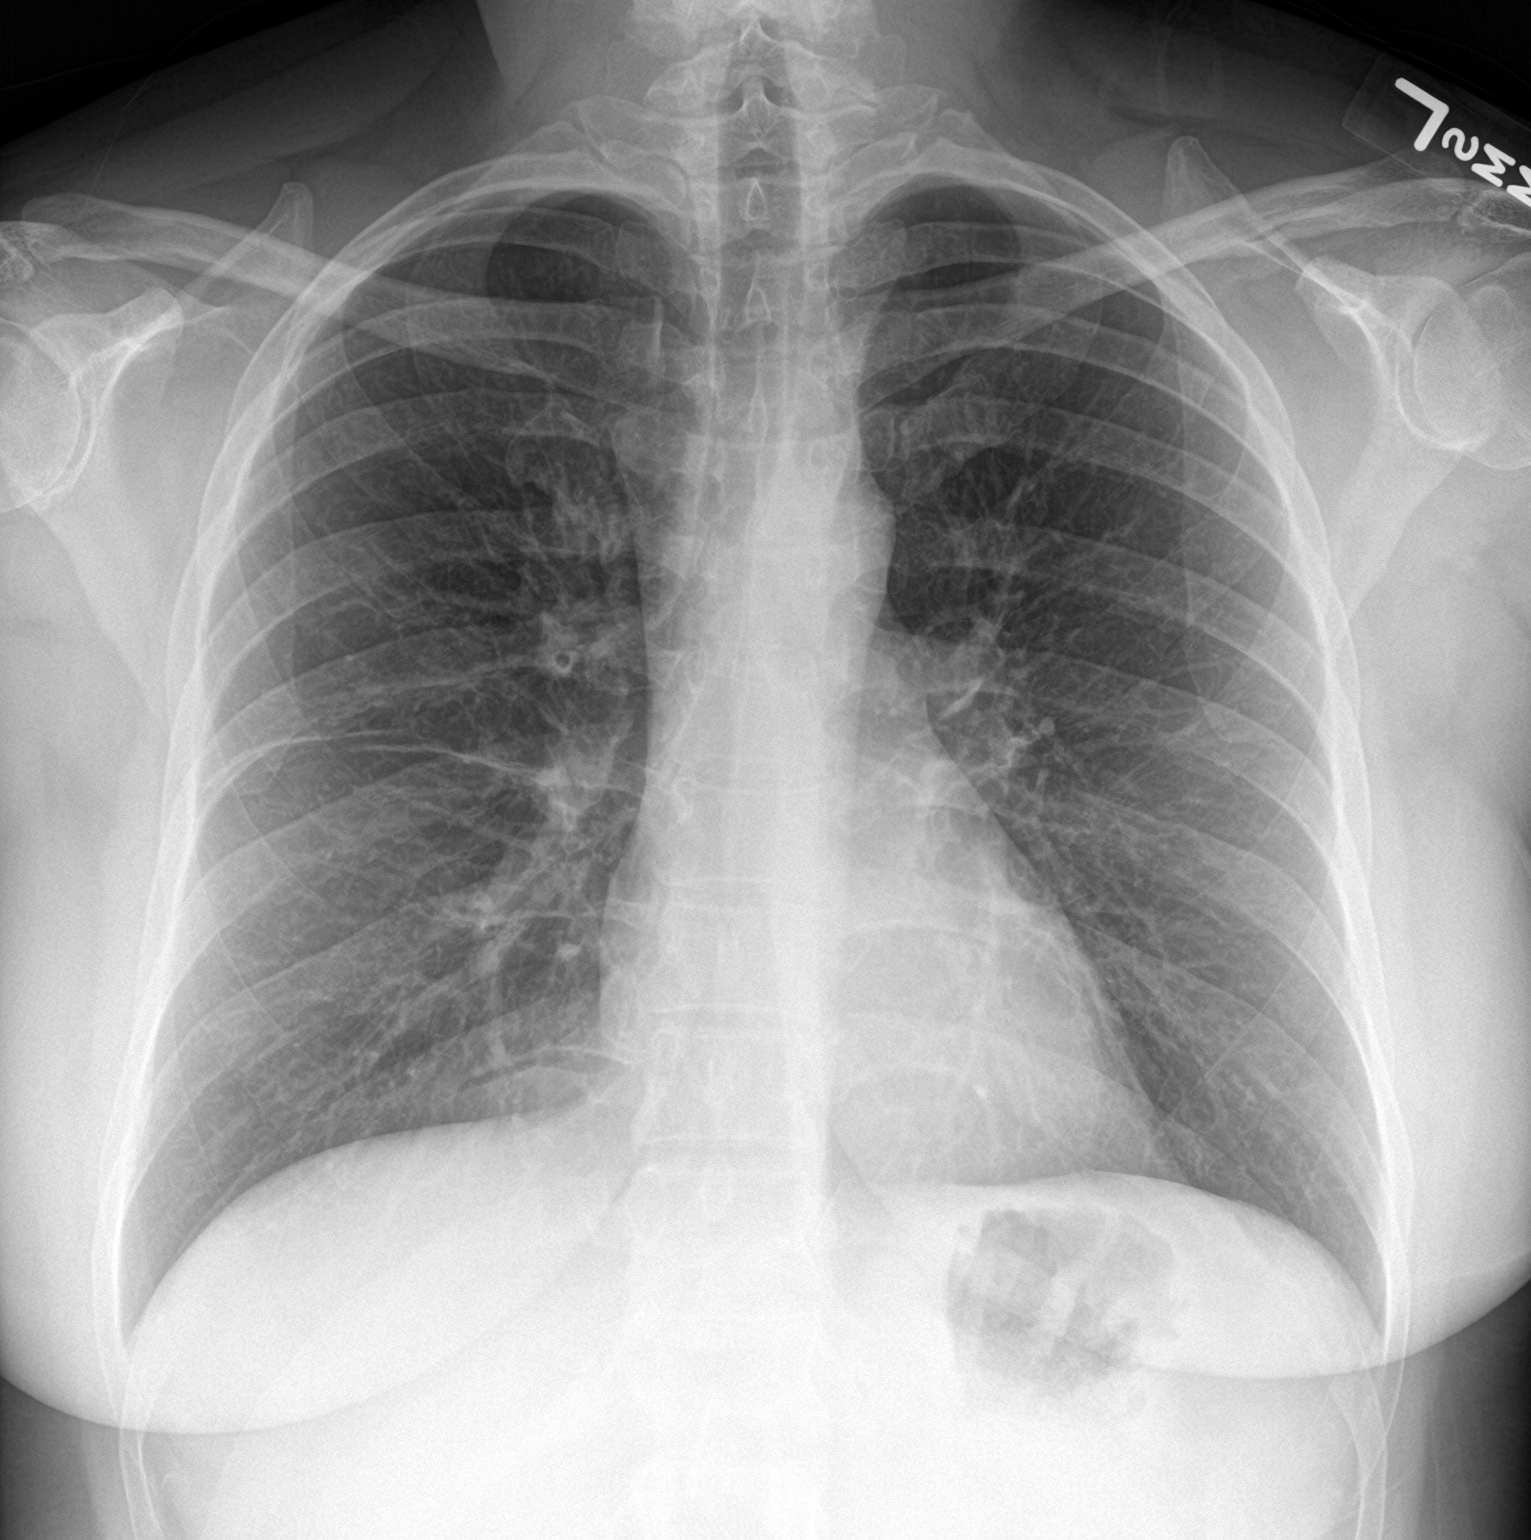

[chest lat]
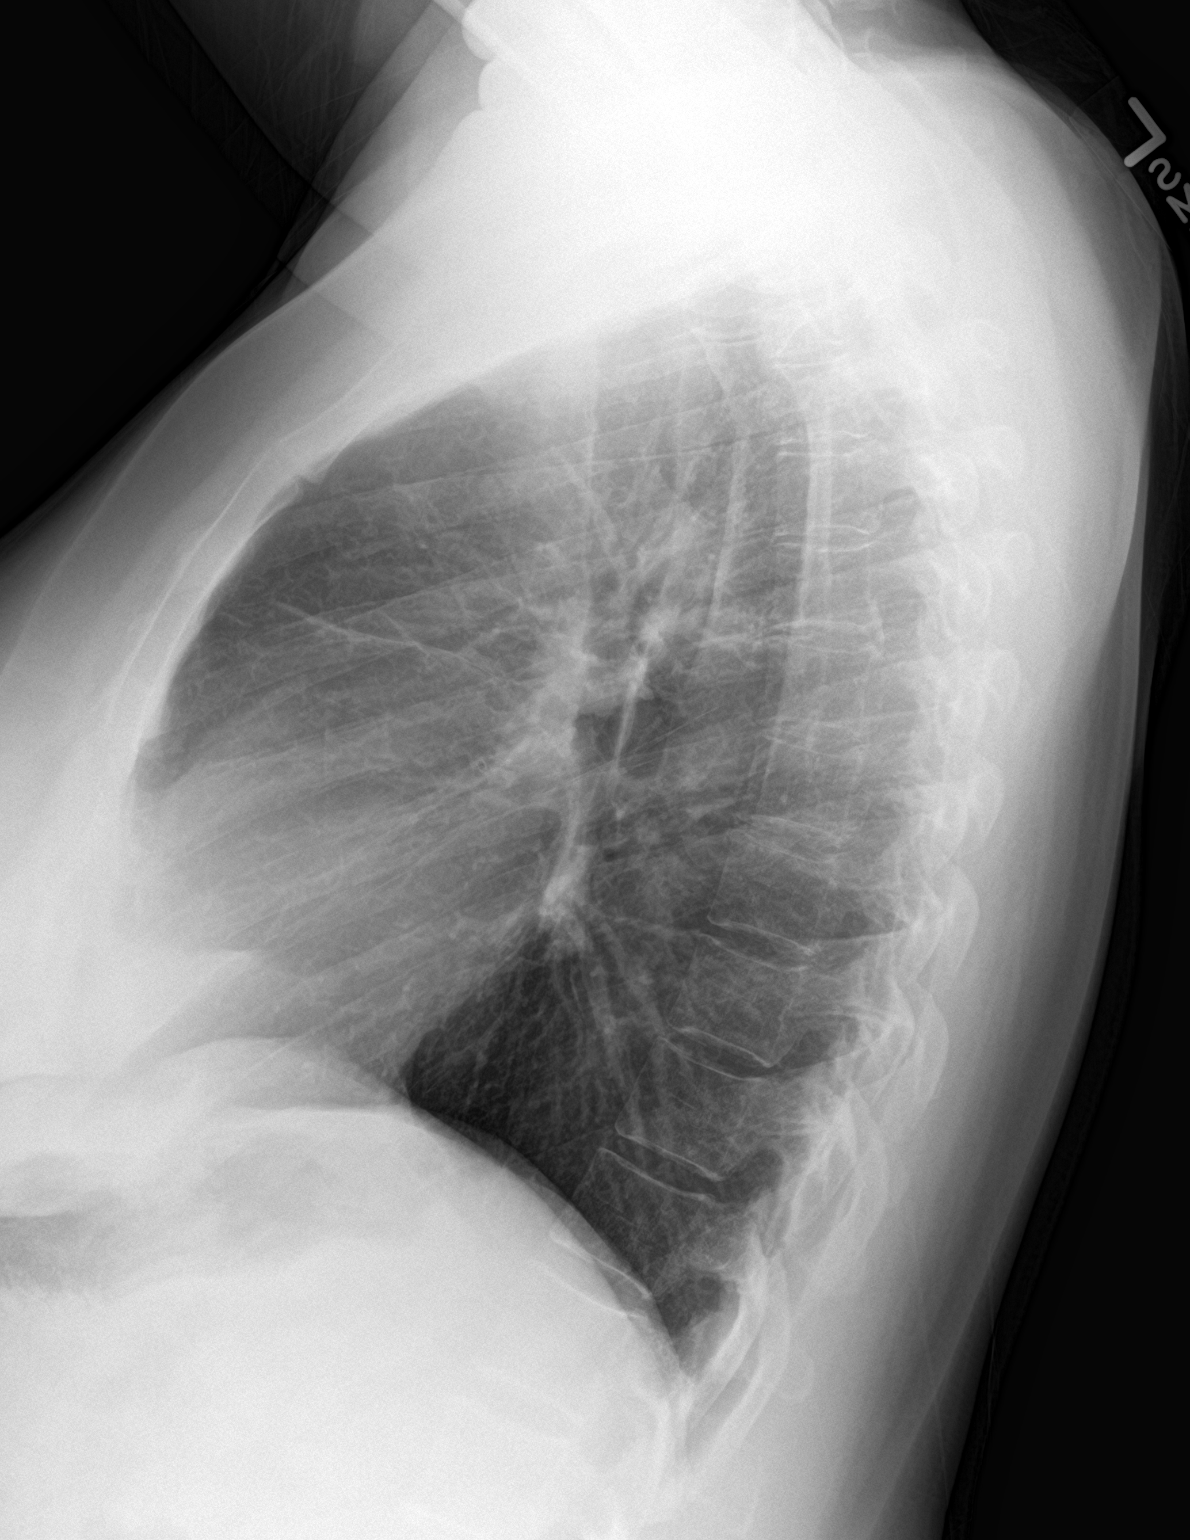

[2 of 2 positions shown; findings below may reference images not displayed]

FINDINGS: The heart size and mediastinal contours are within normal limits. No
pneumothorax or pleural effusion is noted. Left lung is clear.
Minimal right midlung subsegmental atelectasis or scarring is noted.
The visualized skeletal structures are unremarkable.
IMPRESSION: Minimal right midlung subsegmental atelectasis or scarring.

## 2022-03-29 ENCOUNTER — Encounter (HOSPITAL_COMMUNITY): Payer: Self-pay | Admitting: Emergency Medicine

## 2022-03-29 ENCOUNTER — Ambulatory Visit (HOSPITAL_COMMUNITY)
Admission: EM | Admit: 2022-03-29 | Discharge: 2022-03-29 | Disposition: A | Discharge status: Home / Self Care | Payer: Medicaid Other | Attending: Family Medicine | Admitting: Family Medicine

## 2022-03-29 DIAGNOSIS — L509 Urticaria, unspecified: Secondary | ICD-10-CM | POA: Diagnosis not present

## 2022-03-29 DIAGNOSIS — R030 Elevated blood-pressure reading, without diagnosis of hypertension: Secondary | ICD-10-CM

## 2022-03-29 MED ORDER — DEXAMETHASONE SODIUM PHOSPHATE 10 MG/ML IJ SOLN
10.0000 mg | Freq: Once | INTRAMUSCULAR | Status: AC
  Administered 2022-03-29: 10 mg via INTRAMUSCULAR

## 2022-03-29 MED ORDER — DEXAMETHASONE SODIUM PHOSPHATE 10 MG/ML IJ SOLN
INTRAMUSCULAR | Status: AC
  Filled 2022-03-29: qty 1

## 2022-03-29 NOTE — Discharge Instructions (Signed)
Your blood pressure was noted to be elevated during your visit today. If you are currently taking medication for high blood pressure, please ensure you are taking this as directed. If you do not have a history of high blood pressure and your blood pressure remains persistently elevated, you may need to begin taking a medication at some point. You may return here within the next few days to recheck if unable to see your primary care provider or if you do not have a one.  BP (!) 142/102   Pulse 83   Temp 98.4 F (36.9 C)   Resp 18   LMP 03/28/2022   SpO2 96%   BP Readings from Last 3 Encounters:  03/29/22 (!) 142/102  03/12/20 (!) 139/94  03/03/20 (!) 147/101

## 2022-03-29 NOTE — ED Triage Notes (Signed)
Pt is present today with a rash on her face,chest legs, and arms. Pt  states her reaction started this morning. Pt states that she took two benadryl's one hour ago

## 2022-03-29 NOTE — ED Notes (Signed)
This Clinical research associate called patient to triage due to chief complaint. Front Information systems manager made this Clinical research associate aware patient has left building to go get item from car.

## 2022-03-29 NOTE — ED Provider Notes (Signed)
  [facility]   [id number] 03/29/22 Arrival Time: 8101  ASSESSMENT & PLAN:  1. Urticaria   2. Elevated blood pressure reading without diagnosis of hypertension    Given: Meds ordered this encounter  Medications   dexamethasone (DECADRON) injection 10 mg   Unclear trigger. Possibly related to old antibiotic she took this morning.  No respiratory/swallowing difficulties. Benadryl if needed.  Work note provided. May f/u with PCP or here as needed.  Will follow up with PCP or here if worsening or failing to improve as anticipated. Reviewed expectations re: course of current medical issues. Questions answered. Outlined signs and symptoms indicating need for more acute intervention. Patient verbalized understanding. After Visit Summary given.   SUBJECTIVE:  Heather Wade is a 41 y.o. female who presents with a skin complaint. Itchy red rash over face and chest; this am; after taking old antibiotic pill; throat was sore this am. Afebrile. Rash has somewhat improved after Benadryl. No n/v. No respiratory/swallowing difficulties. No h/o similar.  OBJECTIVE: Vitals:   03/29/22 1017  BP: (!) 142/102  Pulse: 83  Resp: 18  Temp: 98.4 F (36.9 C)  SpO2: 96%    General appearance: alert; no distress HEENT: [place]; AT Neck: supple with FROM Lungs: speaking full sentences without difficulty; unlabored Heart: regular Extremities: no edema; moves all extremities normally Skin: warm and dry; smooth, slightly elevated and erythematous plaques of variable size over her face/cheeks and anterior upper chest; + dermatographism Psychological: alert and cooperative; normal mood and affect  Allergies  Allergen Reactions   Metronidazole Hives, Swelling and Rash    Past Medical History:  Diagnosis Date   Asthma    but has never had an asthma attack   Bronchitis    PONV (postoperative nausea and vomiting)    Social History   Socioeconomic History   Marital status:  Single    Spouse name: Not on file   Number of children: Not on file   Years of education: Not on file   Highest education level: Not on file  Occupational History   Not on file  Tobacco Use   Smoking status: Light Smoker   Smokeless tobacco: Never  Substance and Sexual Activity   Alcohol use: Yes    Comment: occasional drinker   Drug use: No   Sexual activity: Yes    Partners: Male    Birth control/protection: I.U.D.  Other Topics Concern   Not on file  Social History Narrative   Not on file   Social Determinants of Health   Financial Resource Strain: Not on file  Food Insecurity: Not on file  Transportation Needs: Not on file  Physical Activity: Not on file  Stress: Not on file  Social Connections: Not on file  Intimate Partner Violence: Not on file   Family History  Problem Relation Age of Onset   Cancer Mother    Cancer Maternal Grandmother    Cancer Maternal Grandfather    Cancer Paternal Grandmother    Cancer Paternal Grandfather    Past Surgical History:  Procedure Laterality Date   BREAST REDUCTION SURGERY Bilateral 12/25/2016   Procedure: MAMMARY REDUCTION  (BREAST) BILATERAL;  Surgeon: Louisa Second, MD;  [facility];  Service: Plastics;  Laterality: Bilateral;   SHOULDER SURGERY Right 2011      Mardella Layman, MD 03/29/22 1147

## 2022-11-06 ENCOUNTER — Encounter (HOSPITAL_BASED_OUTPATIENT_CLINIC_OR_DEPARTMENT_OTHER): Payer: Self-pay | Admitting: Emergency Medicine

## 2022-11-06 ENCOUNTER — Emergency Department (HOSPITAL_BASED_OUTPATIENT_CLINIC_OR_DEPARTMENT_OTHER)
Admission: EM | Admit: 2022-11-06 | Discharge: 2022-11-06 | Disposition: A | Discharge status: Home / Self Care | Payer: Medicaid Other | Attending: Emergency Medicine | Admitting: Emergency Medicine

## 2022-11-06 ENCOUNTER — Other Ambulatory Visit: Payer: Self-pay

## 2022-11-06 ENCOUNTER — Emergency Department (HOSPITAL_BASED_OUTPATIENT_CLINIC_OR_DEPARTMENT_OTHER): Payer: Medicaid Other

## 2022-11-06 DIAGNOSIS — J069 Acute upper respiratory infection, unspecified: Secondary | ICD-10-CM | POA: Insufficient documentation

## 2022-11-06 DIAGNOSIS — Z1152 Encounter for screening for COVID-19: Secondary | ICD-10-CM | POA: Diagnosis not present

## 2022-11-06 DIAGNOSIS — R0602 Shortness of breath: Secondary | ICD-10-CM | POA: Diagnosis present

## 2022-11-06 LAB — RESP PANEL BY RT-PCR (RSV, FLU A&B, COVID)  RVPGX2
Influenza A by PCR: NEGATIVE
Influenza B by PCR: NEGATIVE
Resp Syncytial Virus by PCR: NEGATIVE
SARS Coronavirus 2 by RT PCR: NEGATIVE

## 2022-11-06 LAB — PREGNANCY, URINE: Preg Test, Ur: NEGATIVE

## 2022-11-06 MED ORDER — ALBUTEROL SULFATE (2.5 MG/3ML) 0.083% IN NEBU: 2.5000 mg | INHALATION_SOLUTION | Freq: Four times a day (QID) | RESPIRATORY_TRACT | 1 refills | Status: AC | PRN

## 2022-11-06 MED ORDER — PREDNISONE 10 MG PO TABS: 20.0000 mg | ORAL_TABLET | Freq: Two times a day (BID) | ORAL | 0 refills | Status: DC

## 2022-11-06 MED ORDER — ALBUTEROL SULFATE HFA 108 (90 BASE) MCG/ACT IN AERS
2.0000 | INHALATION_SPRAY | Freq: Once | RESPIRATORY_TRACT | Status: AC
  Administered 2022-11-06: 2 via RESPIRATORY_TRACT
  Filled 2022-11-06: qty 6.7

## 2022-11-06 MED ORDER — PREDNISONE 20 MG PO TABS
40.0000 mg | ORAL_TABLET | Freq: Once | ORAL | Status: AC
  Administered 2022-11-06: 40 mg via ORAL
  Filled 2022-11-06: qty 2

## 2022-11-06 NOTE — ED Provider Notes (Signed)
[facility] Provider Note   CSN: [id number] Arrival date & time: 11/06/22  0153     History  Chief Complaint  Patient presents with   Shortness of Breath    Heather Wade is a 43 y.o. female.  Patient is a 42 year old female with history of asthma.  Patient presenting today with complaints of shortness of breath and URI symptoms for the past several days.  She has been using her Ventolin inhaler with little relief.  She has used ProAir in the past which seems to work better for her, but was switched to Ventolin and does not feel as though this is as effective.  She denies any chest pain.  She denies ill contacts.  The history is provided by the patient.       Home Medications Prior to Admission medications   Medication Sig Start Date End Date Taking? Authorizing Provider  albuterol (VENTOLIN HFA) 108 (90 Base) MCG/ACT inhaler Inhale 2 puffs into the lungs every 4 (four) hours as needed. For shortness of breath. 03/12/20   Raylene Everts, MD  ALPRAZolam Duanne Moron) 0.25 MG tablet Take 0.125-0.25 mg by mouth 2 (two) times daily as needed for anxiety. 07/07/19   [provider]  budesonide-formoterol (SYMBICORT) 160-4.5 MCG/ACT inhaler Inhale 2 puffs into the lungs 2 (two) times daily. 03/12/20   Raylene Everts, MD      Allergies    Amoxicillin and Metronidazole    Review of Systems   Review of Systems  All other systems reviewed and are negative.   Physical Exam Updated Vital Signs BP (!) 135/98 (BP Location: Right Arm)   Pulse 79   Temp 98.3 F (36.8 C) (Oral)   Resp (!) 24   Ht 5\' 4"  (1.626 m)   Wt 74.8 kg   SpO2 100%   BMI 28.32 kg/m  Physical Exam Vitals and nursing note reviewed.  Constitutional:      General: She is not in acute distress.    Appearance: She is well-developed. She is not diaphoretic.  HENT:     Head: Normocephalic and atraumatic.  Cardiovascular:     Rate and Rhythm: Normal  rate and regular rhythm.     Heart sounds: No murmur heard.    No friction rub. No gallop.  Pulmonary:     Effort: Pulmonary effort is normal. No respiratory distress.     Breath sounds: Normal breath sounds. No wheezing.  Abdominal:     General: Bowel sounds are normal. There is no distension.     Palpations: Abdomen is soft.     Tenderness: There is no abdominal tenderness.  Musculoskeletal:        General: Normal range of motion.     Cervical back: Normal range of motion and neck supple.  Skin:    General: Skin is warm and dry.  Neurological:     General: No focal deficit present.     Mental Status: She is alert and oriented to person, place, and time.     ED Results / Procedures / Treatments   Labs (all labs ordered are listed, but only abnormal results are displayed) Labs Reviewed  RESP PANEL BY RT-PCR (RSV, FLU A&B, COVID)  [id number]  PREGNANCY, URINE    EKG None  Radiology DG Chest 2 View  Result Date: 11/06/2022 CLINICAL DATA:  Cough, shortness of breath, asthma EXAM: CHEST - 2 VIEW COMPARISON:  03/12/2020 FINDINGS: The heart size and mediastinal contours are  within normal limits. Both lungs are clear. The visualized skeletal structures are unremarkable. IMPRESSION: No active cardiopulmonary disease. Electronically Signed   By: Placido Sou M.D.   On: 11/06/2022 02:46    Procedures Procedures    Medications Ordered in ED Medications  albuterol (VENTOLIN HFA) 108 (90 Base) MCG/ACT inhaler 2 puff (has no administration in time range)  predniSONE (DELTASONE) tablet 40 mg (has no administration in time range)    ED Course/ Medical Decision Making/ A&P  Patient presenting with complaints of shortness of breath and URI symptoms.  She arrives here with stable vital signs and physical examination which is basically unremarkable.  Oxygen saturations are 100% on room air with no tachypnea or tachycardia.  Workup initiated including COVID swab which was negative for  COVID, flu, and RSV.  Chest x-ray performed shows no acute process.  Patient's symptoms most likely viral in nature with some underlying reactive airway.  She is not actively wheezing in the ER, but will be given a ProAir inhaler as this seems to be more effective.  I will also prescribe prednisone and refill the prescription for albuterol solution she is out of at home.  Final Clinical Impression(s) / ED Diagnoses Final diagnoses:  None    Rx / DC Orders ED Discharge Orders     None         Veryl Speak, MD 11/06/22 [phone]

## 2022-11-06 NOTE — ED Triage Notes (Signed)
Pt states she feeling "less than 100%" since Friday. Reports sore throat, productive cough, runny nose. SHOB that started yesterday, initially inhalers provided some relief but pt concerned about how often she was needing to use her albuterol inhaler. Asthma hx. Reports intermittent dizziness, but denies presently. Oxygen saturation 100%, pt in NAD, able to answer questions in complete sentences.

## 2022-11-06 NOTE — Discharge Instructions (Signed)
Begin taking prednisone as prescribed.  Albuterol nebulizer treatment every 4 hours as needed along with use of your rescue inhaler.  Follow-up with primary doctor if not improving in the next week.

## 2022-11-06 NOTE — Progress Notes (Signed)
Patient stated she last used her rescue inhaler at 1 am.

## 2022-11-06 NOTE — ED Notes (Signed)
Pt c/o SOB since Thursday also c/o sore thoat, congestion and cough.  NO fevers report.

## 2023-06-27 ENCOUNTER — Ambulatory Visit
Admission: EM | Admit: 2023-06-27 | Discharge: 2023-06-27 | Disposition: A | Discharge status: Home / Self Care | Payer: Self-pay | Attending: Internal Medicine | Admitting: Internal Medicine

## 2023-06-27 DIAGNOSIS — H65196 Other acute nonsuppurative otitis media, recurrent, bilateral: Secondary | ICD-10-CM

## 2023-06-27 DIAGNOSIS — H6993 Unspecified Eustachian tube disorder, bilateral: Secondary | ICD-10-CM

## 2023-06-27 DIAGNOSIS — J310 Chronic rhinitis: Secondary | ICD-10-CM

## 2023-06-27 MED ORDER — CEFDINIR 300 MG PO CAPS: 300.0000 mg | ORAL_CAPSULE | Freq: Two times a day (BID) | ORAL | 0 refills | Status: DC

## 2023-06-27 NOTE — Discharge Instructions (Addendum)
We will manage this as a double ear infection with cefdinir. For sore throat or cough try using a honey-based tea. Use 3 teaspoons of honey with juice squeezed from half lemon. Place shaved pieces of ginger into 1/2-1 cup of water and warm over stove top. Then mix the ingredients and repeat every 4 hours as needed. Please take ibuprofen 600mg  every 6 hours with food alternating with OR taken together with Tylenol 650mg  every 6 hours for throat pain, fevers, aches and pains. Hydrate very well with at least 2 liters of water. Eat light meals such as soups (chicken and noodles, vegetable, chicken and wild rice).  Do not eat foods that you are allergic to.  Keep taking Allegra which can help against postnasal drainage, sinus congestion which can cause sinus pain, sinus headaches, throat pain, painful swallowing, coughing.  You can take this together with pseudoephedrine (Sudafed) at a dose of 60 mg 3 times a day or twice daily as needed for the same kind of nasal drip, congestion.

## 2023-06-27 NOTE — ED Triage Notes (Signed)
Pt c/o bilat earache x 4 days-right worse than left-taking sudafed, allegra, advil, nasal spray-NAD-steady gait

## 2023-06-27 NOTE — ED Provider Notes (Signed)
[facility]  Note:  This document was prepared using Conservation officer, historic buildings and may include unintentional dictation errors.  MRN: [id number] DOB: [birth year]  Subjective:   Heather Wade is a 42 y.o. female presenting for 4-day history of moderate to severe bilateral ear pain worse over the right.  No fever, runny or stuffy nose, sore throat, cough, chest pain, shortness of breath or wheezing.  Has a history of chronic rhinitis and does not take allergy medicines consistently.  For this current episode she has been using Allegra, pseudoephedrine, Advil and nasal sprays.  Also has a history of asthma and bronchitis. No smoking of any kind including cigarettes, cigars, vaping, marijuana use.     No current facility-administered medications for this encounter.  Current Outpatient Medications:    albuterol (PROVENTIL) (2.5 MG/3ML) 0.083% nebulizer solution, Take 3 mLs (2.5 mg total) by nebulization every 6 (six) hours as needed for wheezing or shortness of breath., Disp: 75 mL, Rfl: 1   ALPRAZolam (XANAX) 0.25 MG tablet, Take 0.125-0.25 mg by mouth 2 (two) times daily as needed for anxiety., Disp: , Rfl:    budesonide-formoterol (SYMBICORT) 160-4.5 MCG/ACT inhaler, Inhale 2 puffs into the lungs 2 (two) times daily., Disp: 1 Inhaler, Rfl: 0   predniSONE (DELTASONE) 10 MG tablet, Take 2 tablets (20 mg total) by mouth 2 (two) times daily., Disp: 20 tablet, Rfl: 0   Allergies  Allergen Reactions   Amoxicillin    Metronidazole Hives, Swelling and Rash    Past Medical History:  Diagnosis Date   Asthma    but has never had an asthma attack   Bronchitis    PONV (postoperative nausea and vomiting)      Past Surgical History:  Procedure Laterality Date   BREAST REDUCTION SURGERY Bilateral 12/25/2016   Procedure: MAMMARY REDUCTION  (BREAST) BILATERAL;  Surgeon: Louisa Second, MD;  Location: [facility];  Service: Plastics;  Laterality:  Bilateral;   SHOULDER SURGERY Right 2011    Family History  Problem Relation Age of Onset   Cancer Mother    Cancer Maternal Grandmother    Cancer Maternal Grandfather    Cancer Paternal Grandmother    Cancer Paternal Grandfather     Social History   Tobacco Use   Smoking status: Former    Current packs/day: 0.00    Types: Cigarettes    Quit date: 03/09/2022    Years since quitting: 1.3   Smokeless tobacco: Never  Vaping Use   Vaping status: Never Used  Substance Use Topics   Alcohol use: Yes    Comment: occasional drinker   Drug use: No    ROS   Objective:   Vitals: BP (!) 135/90 (BP Location: Left Arm)   Pulse 79   Temp 98.1 F (36.7 C) (Oral)   Resp 20   LMP 06/13/2023   SpO2 95%   Physical Exam Constitutional:      General: She is not in acute distress.    Appearance: Normal appearance. She is well-developed. She is not ill-appearing, toxic-appearing or diaphoretic.  HENT:     Head: Normocephalic and atraumatic.     Right Ear: Ear canal and external ear normal. No tenderness. There is no impacted cerumen. Tympanic membrane is erythematous and bulging. Tympanic membrane is not injected or perforated.     Left Ear: Ear canal and external ear normal. No tenderness. There is no impacted cerumen. Tympanic membrane is erythematous. Tympanic membrane is not injected, perforated  or bulging.     Nose: Nose normal.     Mouth/Throat:     Mouth: Mucous membranes are moist.  Eyes:     General: No scleral icterus.       Right eye: No discharge.        Left eye: No discharge.     Extraocular Movements: Extraocular movements intact.  Cardiovascular:     Rate and Rhythm: Normal rate.  Pulmonary:     Effort: Pulmonary effort is normal.  Skin:    General: Skin is warm and dry.  Neurological:     General: No focal deficit present.     Mental Status: She is alert and oriented to person, place, and time.  Psychiatric:        Mood and Affect: Mood normal.         Behavior: Behavior normal.     Assessment and Plan :   PDMP not reviewed this encounter.  1. Other recurrent acute nonsuppurative otitis media of both ears   2. Chronic rhinitis   3. Eustachian tube dysfunction, bilateral    Start cefdinir to cover for otitis media.  This is likely a complication from her acute on chronic rhinitis.  Recommend continued use of the antihistamine, decongestant.  Use supportive care otherwise. Counseled patient on potential for adverse effects with medications prescribed/recommended today, ER and return-to-clinic precautions discussed, patient verbalized understanding.    Wallis Bamberg, [place] 06/27/23 1444

## 2023-07-06 ENCOUNTER — Ambulatory Visit
Admission: EM | Admit: 2023-07-06 | Discharge: 2023-07-06 | Disposition: A | Discharge status: Home / Self Care | Payer: Medicaid Other | Attending: Internal Medicine | Admitting: Internal Medicine

## 2023-07-06 DIAGNOSIS — J310 Chronic rhinitis: Secondary | ICD-10-CM

## 2023-07-06 DIAGNOSIS — H6993 Unspecified Eustachian tube disorder, bilateral: Secondary | ICD-10-CM

## 2023-07-06 MED ORDER — AZITHROMYCIN 250 MG PO TABS: ORAL_TABLET | ORAL | 0 refills | Status: AC

## 2023-07-06 MED ORDER — PREDNISONE 20 MG PO TABS: ORAL_TABLET | ORAL | 0 refills | Status: AC

## 2023-07-06 NOTE — ED Triage Notes (Signed)
Patient presents to [facility] for possible ear infection. States continued ear fullness since last visit. Has completed all treatment as instructed.   Denies fever or drainage.

## 2023-07-06 NOTE — ED Provider Notes (Signed)
[facility]  Note:  This document was prepared using Conservation officer, historic buildings and may include unintentional dictation errors.  MRN: [id number] DOB: 08-25-1981  Subjective:   Heather Wade is a 42 y.o. female presenting for recheck on persistent ear irritation. Was seen and completed cefdinir course. This helped with the pain some but still has ear fullness, popping and intermittent pain. She did improve but has waxed waning symptoms.  Has a history of chronic rhinitis.  Takes Allegra daily.  Also has a history of asthma.  She has been using nasal sprays as well.  No current facility-administered medications for this encounter.  Current Outpatient Medications:    albuterol (PROVENTIL) (2.5 MG/3ML) 0.083% nebulizer solution, Take 3 mLs (2.5 mg total) by nebulization every 6 (six) hours as needed for wheezing or shortness of breath., Disp: 75 mL, Rfl: 1   ALPRAZolam (XANAX) 0.25 MG tablet, Take 0.125-0.25 mg by mouth 2 (two) times daily as needed for anxiety., Disp: , Rfl:    budesonide-formoterol (SYMBICORT) 160-4.5 MCG/ACT inhaler, Inhale 2 puffs into the lungs 2 (two) times daily., Disp: 1 Inhaler, Rfl: 0   cefdinir (OMNICEF) 300 MG capsule, Take 1 capsule (300 mg total) by mouth 2 (two) times daily., Disp: 20 capsule, Rfl: 0   predniSONE (DELTASONE) 10 MG tablet, Take 2 tablets (20 mg total) by mouth 2 (two) times daily., Disp: 20 tablet, Rfl: 0   Allergies  Allergen Reactions   Amoxicillin    Metronidazole Hives, Swelling and Rash    Past Medical History:  Diagnosis Date   Asthma    but has never had an asthma attack   Bronchitis    PONV (postoperative nausea and vomiting)      Past Surgical History:  Procedure Laterality Date   BREAST REDUCTION SURGERY Bilateral 12/25/2016   Procedure: MAMMARY REDUCTION  (BREAST) BILATERAL;  Surgeon: Louisa Second, MD;  Location: [facility];  Service: Plastics;  Laterality: Bilateral;    SHOULDER SURGERY Right 2011    Family History  Problem Relation Age of Onset   Cancer Mother    Cancer Maternal Grandmother    Cancer Maternal Grandfather    Cancer Paternal Grandmother    Cancer Paternal Grandfather     Social History   Tobacco Use   Smoking status: Former    Current packs/day: 0.00    Types: Cigarettes    Quit date: 03/09/2022    Years since quitting: 1.3   Smokeless tobacco: Never  Vaping Use   Vaping status: Never Used  Substance Use Topics   Alcohol use: Yes    Comment: weekly   Drug use: No    ROS   Objective:   Vitals: BP 138/72 (BP Location: Right Arm)   Pulse 82   Temp 98.7 F (37.1 C) (Oral)   Resp 18   LMP 06/13/2023   SpO2 97%   Physical Exam Constitutional:      General: She is not in acute distress.    Appearance: Normal appearance. She is well-developed and normal weight. She is not ill-appearing, toxic-appearing or diaphoretic.  HENT:     Head: Normocephalic and atraumatic.     Right Ear: Ear canal and external ear normal. No drainage or tenderness. A middle ear effusion is present. There is no impacted cerumen. Tympanic membrane is not erythematous or bulging.     Left Ear: Ear canal and external ear normal. No drainage or tenderness.  No middle ear effusion. There is  no impacted cerumen. Tympanic membrane is erythematous and bulging.     Nose: No congestion or rhinorrhea.     Mouth/Throat:     Mouth: Mucous membranes are moist. No oral lesions.     Pharynx: No pharyngeal swelling, oropharyngeal exudate, posterior oropharyngeal erythema or uvula swelling.     Tonsils: No tonsillar exudate or tonsillar abscesses.  Eyes:     General: No scleral icterus.       Right eye: No discharge.        Left eye: No discharge.     Extraocular Movements: Extraocular movements intact.     Right eye: Normal extraocular motion.     Left eye: Normal extraocular motion.     Conjunctiva/sclera: Conjunctivae normal.  Cardiovascular:      Rate and Rhythm: Normal rate.  Pulmonary:     Effort: Pulmonary effort is normal.  Musculoskeletal:     Cervical back: Normal range of motion and neck supple.  Lymphadenopathy:     Cervical: No cervical adenopathy.  Skin:    General: Skin is warm and dry.  Neurological:     General: No focal deficit present.     Mental Status: She is alert and oriented to person, place, and time.  Psychiatric:        Mood and Affect: Mood normal.        Behavior: Behavior normal.     Assessment and Plan :   PDMP not reviewed this encounter.  1. Chronic rhinitis   2. Eustachian tube dysfunction, bilateral    In the context of her chronic rhinitis and eustachian tube dysfunction I am recommending an oral prednisone course.  She underwent a trial of cefdinir with some improvement.  Recommend maintaining Allegra.  Will also supplement her treatment with azithromycin course as she failed to achieve complete resolution with cefdinir.  Counseled patient on potential for adverse effects with medications prescribed/recommended today, ER and return-to-clinic precautions discussed, patient verbalized understanding.    Wallis Bamberg, [place] 07/06/23 1552

## 2023-07-08 ENCOUNTER — Telehealth: Payer: Self-pay

## 2023-07-08 ENCOUNTER — Telehealth: Payer: Self-pay | Admitting: Urgent Care

## 2023-07-08 MED ORDER — FLUCONAZOLE 150 MG PO TABS: 150.0000 mg | ORAL_TABLET | ORAL | 0 refills | Status: AC

## 2023-07-08 NOTE — Telephone Encounter (Signed)
Patient having an antibiotic associated yeast infection.  Will call in fluconazole.  Please notify patient.
